# Patient Record
Sex: Female | Born: 1970
Health system: Southern US, Community
[De-identification: ages and names within clinical notes are randomized; demographics above are authoritative.]

## PROBLEM LIST (undated history)

## (undated) DIAGNOSIS — I10 Essential (primary) hypertension: Secondary | ICD-10-CM

## (undated) DIAGNOSIS — Z9889 Other specified postprocedural states: Secondary | ICD-10-CM

## (undated) DIAGNOSIS — R112 Nausea with vomiting, unspecified: Secondary | ICD-10-CM

## (undated) DIAGNOSIS — E039 Hypothyroidism, unspecified: Secondary | ICD-10-CM

## (undated) DIAGNOSIS — F329 Major depressive disorder, single episode, unspecified: Secondary | ICD-10-CM

## (undated) DIAGNOSIS — F32A Depression, unspecified: Secondary | ICD-10-CM

## (undated) DIAGNOSIS — J302 Other seasonal allergic rhinitis: Secondary | ICD-10-CM

## (undated) HISTORY — DX: Other seasonal allergic rhinitis: J30.2

## (undated) HISTORY — PX: ECTOPIC PREGNANCY SURGERY: SHX613

## (undated) HISTORY — DX: Hypothyroidism, unspecified: E03.9

## (undated) HISTORY — DX: Major depressive disorder, single episode, unspecified: F32.9

## (undated) HISTORY — DX: Depression, unspecified: F32.A

---

## 1990-05-10 HISTORY — PX: SHOULDER SURGERY: SHX246

## 1997-11-14 ENCOUNTER — Other Ambulatory Visit: Admission: RE | Admit: 1997-11-14 | Discharge: 1997-11-14 | Payer: Self-pay | Admitting: *Deleted

## 1997-11-18 ENCOUNTER — Observation Stay (HOSPITAL_COMMUNITY): Admission: AD | Admit: 1997-11-18 | Discharge: 1997-11-19 | Payer: Self-pay | Admitting: Gynecology

## 1998-05-08 ENCOUNTER — Encounter: Payer: Self-pay | Admitting: Obstetrics and Gynecology

## 1998-05-08 ENCOUNTER — Ambulatory Visit (HOSPITAL_COMMUNITY): Admission: RE | Admit: 1998-05-08 | Discharge: 1998-05-08 | Payer: Self-pay | Admitting: Obstetrics and Gynecology

## 1998-05-11 ENCOUNTER — Ambulatory Visit (HOSPITAL_COMMUNITY): Admission: RE | Admit: 1998-05-11 | Discharge: 1998-05-11 | Payer: Self-pay | Admitting: *Deleted

## 1998-05-12 ENCOUNTER — Ambulatory Visit (HOSPITAL_COMMUNITY): Admission: RE | Admit: 1998-05-12 | Discharge: 1998-05-12 | Payer: Self-pay | Admitting: Obstetrics and Gynecology

## 1998-05-13 ENCOUNTER — Ambulatory Visit (HOSPITAL_COMMUNITY): Admission: RE | Admit: 1998-05-13 | Discharge: 1998-05-13 | Payer: Self-pay | Admitting: Obstetrics and Gynecology

## 1999-01-27 ENCOUNTER — Inpatient Hospital Stay (HOSPITAL_COMMUNITY): Admission: AD | Admit: 1999-01-27 | Discharge: 1999-01-27 | Payer: Self-pay | Admitting: Obstetrics and Gynecology

## 1999-05-10 ENCOUNTER — Inpatient Hospital Stay (HOSPITAL_COMMUNITY): Admission: AD | Admit: 1999-05-10 | Discharge: 1999-05-13 | Payer: Self-pay | Admitting: Obstetrics and Gynecology

## 1999-06-04 ENCOUNTER — Encounter: Admission: RE | Admit: 1999-06-04 | Discharge: 1999-09-02 | Payer: Self-pay | Admitting: Obstetrics and Gynecology

## 1999-06-16 ENCOUNTER — Other Ambulatory Visit: Admission: RE | Admit: 1999-06-16 | Discharge: 1999-06-16 | Payer: Self-pay | Admitting: Obstetrics and Gynecology

## 2001-02-22 ENCOUNTER — Other Ambulatory Visit: Admission: RE | Admit: 2001-02-22 | Discharge: 2001-02-22 | Payer: Self-pay | Admitting: *Deleted

## 2001-02-22 ENCOUNTER — Encounter (INDEPENDENT_AMBULATORY_CARE_PROVIDER_SITE_OTHER): Payer: Self-pay | Admitting: Specialist

## 2001-04-10 ENCOUNTER — Other Ambulatory Visit: Admission: RE | Admit: 2001-04-10 | Discharge: 2001-04-10 | Payer: Self-pay | Admitting: Gynecology

## 2002-02-07 ENCOUNTER — Inpatient Hospital Stay (HOSPITAL_COMMUNITY): Admission: AD | Admit: 2002-02-07 | Discharge: 2002-02-09 | Payer: Self-pay | Admitting: Obstetrics and Gynecology

## 2002-03-14 ENCOUNTER — Other Ambulatory Visit: Admission: RE | Admit: 2002-03-14 | Discharge: 2002-03-14 | Payer: Self-pay | Admitting: Obstetrics and Gynecology

## 2003-08-12 ENCOUNTER — Other Ambulatory Visit: Admission: RE | Admit: 2003-08-12 | Discharge: 2003-08-12 | Payer: Self-pay | Admitting: Gynecology

## 2004-09-24 ENCOUNTER — Other Ambulatory Visit: Admission: RE | Admit: 2004-09-24 | Discharge: 2004-09-24 | Payer: Self-pay | Admitting: Gynecology

## 2005-10-14 ENCOUNTER — Other Ambulatory Visit: Admission: RE | Admit: 2005-10-14 | Discharge: 2005-10-14 | Payer: Self-pay | Admitting: Gynecology

## 2005-12-08 ENCOUNTER — Ambulatory Visit: Payer: Self-pay | Admitting: Gastroenterology

## 2005-12-30 ENCOUNTER — Encounter: Admission: RE | Admit: 2005-12-30 | Discharge: 2005-12-30 | Payer: Self-pay | Admitting: Gynecology

## 2006-11-29 ENCOUNTER — Other Ambulatory Visit: Admission: RE | Admit: 2006-11-29 | Discharge: 2006-11-29 | Payer: Self-pay | Admitting: Gynecology

## 2009-05-29 ENCOUNTER — Encounter: Admission: RE | Admit: 2009-05-29 | Discharge: 2009-05-29 | Payer: Self-pay | Admitting: Gynecology

## 2010-08-25 ENCOUNTER — Other Ambulatory Visit: Payer: Self-pay | Admitting: Gynecology

## 2010-08-25 DIAGNOSIS — Z1231 Encounter for screening mammogram for malignant neoplasm of breast: Secondary | ICD-10-CM

## 2010-09-09 ENCOUNTER — Ambulatory Visit
Admission: RE | Admit: 2010-09-09 | Discharge: 2010-09-09 | Disposition: A | Discharge status: Home / Self Care | Payer: BC Managed Care – PPO | Source: Ambulatory Visit | Attending: Gynecology | Admitting: Gynecology

## 2010-09-09 DIAGNOSIS — Z1231 Encounter for screening mammogram for malignant neoplasm of breast: Secondary | ICD-10-CM

## 2010-09-25 NOTE — Assessment & Plan Note (Signed)
[facility]                           GASTROENTEROLOGY OFFICE NOTE   NAME:Melissa House, Melissa House                       MRN:          [id number]  DATE:12/08/2005                            DOB:          02/24/1971    CHIEF COMPLAINT:  A 40 year old white female, self-referred for evaluation  of diarrhea and right-sided abdominal pain.   HISTORY OF PRESENT ILLNESS:  Ms. Gugel is followed by Dr. Chevis Pretty and Dr.  Corliss Blacker.  She relates problems with some intermittent right lower quadrant  pain that is related to ovulation.  This has been present for several years.  She has had progressively worsening problems with postprandial diarrhea.  It  has occurred for over 15 years but has generally been mild.  Over the past  several months her symptoms have increased and they now occur with almost  every meal she eats out and occasionally with meals at home.  She has some  mild lower abdominal cramping and substantial urgency.  The diarrhea was  associated with right lower quadrant pain on one occasion.  She notes no  melena, hematochezia, change in stool caliber, abdominal pain, rectal pain,  weight loss, nausea or vomiting, fevers or chills.   FAMILY HISTORY:  Negative for colon cancer, colon polyps or inflammatory  bowel disease.   She was seen by Dr. Chevis Pretty and apparently a pelvic ultrasound was  unremarkable, but the patient reports that Dr. Chevis Pretty thought he felt a  lesion between her right lower quadrant.  Unfortunately, I have no referral  records from Dr. Chevis Pretty at the time of this dictation.   PAST MEDICAL HISTORY:  1.  Hypothyroidism.  2.  Asthma.  3.  Allergies.  4.  Ectopic pregnancy in 1999.  5.  Status post left shoulder surgery in 1992.   CURRENT MEDICATIONS:  Listed on the chart, updated and reviewed.   MEDICATION ALLERGIES:  None known.   SOCIAL HISTORY:  Per handwritten form.   REVIEW OF SYSTEMS:  Several areas positive per handwritten  form.   PHYSICAL EXAMINATION:  GENERAL:  In no acute distress.  Height 5 feet 5  inches, weight 152.4 pounds, blood pressure is 100/56, pulse 68 and regular.  HEENT:  Anicteric sclerae.  Oropharynx clear.  CHEST:  Clear to auscultation bilaterally.  CARDIAC:  Regular rate and rhythm without murmurs appreciated.  ABDOMEN:  Soft, nontender, nondistended, normoactive bowel sounds.  No  palpable organomegaly, masses or hernias.  RECTAL:  Examination deferred to time of colonoscopy.  EXTREMITIES:  Without clubbing, cyanosis, or edema.  NEUROLOGIC:  Alert and oriented x3.  Grossly nonfocal.   ASSESSMENT AND PLAN:  Worsening postprandial diarrhea.  Presumed irritable  bowel syndrome.  Rule out inflammatory bowel disease, colorectal neoplasms,  celiac disease and other disorders.  Obtain stool Hemoccults, a CBC, CMET,  erythrocyte sedimentation rate and tissue transglutaminase.  She will begin  using Levsin 1-2 sublingually or orally before meals as needed.  Risks,  benefits, and alternatives to colonoscopy with possible biopsy and possible  polypectomy discussed with the patient and she consents to proceed.  This  will  be scheduled electively.  Consider further evaluation with a CT scan of  the abdomen and pelvis.                                   Venita Lick. Pleas Koch., MD, [facility]   MTS/MedQ  DD:  12/10/2005  DT:  12/10/2005  Job #:  [id number]

## 2011-05-27 ENCOUNTER — Encounter: Payer: Self-pay | Admitting: Gastroenterology

## 2011-06-09 ENCOUNTER — Encounter: Payer: Self-pay | Admitting: Gastroenterology

## 2011-06-09 ENCOUNTER — Ambulatory Visit (INDEPENDENT_AMBULATORY_CARE_PROVIDER_SITE_OTHER): Payer: BC Managed Care – PPO | Admitting: Gastroenterology

## 2011-06-09 VITALS — BP 120/60 | HR 75 | Ht 65.0 in | Wt 169.0 lb

## 2011-06-09 DIAGNOSIS — K219 Gastro-esophageal reflux disease without esophagitis: Secondary | ICD-10-CM | POA: Insufficient documentation

## 2011-06-09 MED ORDER — OMEPRAZOLE 40 MG PO CPDR: 40.0000 mg | DELAYED_RELEASE_CAPSULE | Freq: Every day | ORAL | Status: DC

## 2011-06-09 NOTE — Patient Instructions (Signed)
You have been scheduled for a Upper Endoscopy with propofol. See separate instructions. Patient advised to avoid spicy, acidic, citrus, chocolate, mints, fruit and fruit juices.  Limit the intake of caffeine, alcohol and Soda.  Don't exercise too soon after eating.  Don't lie down within 3-4 hours of eating.  Elevate the head of your bed. cc: Montey Hora, MD

## 2011-06-09 NOTE — Progress Notes (Signed)
History of Present Illness: This is a 41 year old female who relates a several month history of frequent belching, heartburn and globus sensation. She developed a pinched nerve in her back and treated with NSAIDs pain medications and steroid injections first several months beginning in October and her symptoms started around that time. She was placed on omeprazole 40 mg daily and her symptoms have generally been well controlled for the past month. Denies weight loss, abdominal pain, constipation, diarrhea, change in stool caliber, melena, hematochezia, nausea, vomiting, dysphagia.  Review of Systems: Pertinent positive and negative review of systems were noted in the above HPI section. All other review of systems were otherwise negative.  Current Medications, Allergies, Past Medical History, Past Surgical History, Family History and Social History were reviewed in [employer] record.  Physical Exam: General: Well developed , well nourished, no acute distress Head: Normocephalic and atraumatic Eyes:  sclerae anicteric, EOMI Ears: Normal auditory acuity Mouth: No deformity or lesions Neck: Supple, no masses or thyromegaly Lungs: Clear throughout to auscultation Heart: Regular rate and rhythm; no murmurs, rubs or bruits Abdomen: Soft, non tender and non distended. No masses, hepatosplenomegaly or hernias noted. Normal Bowel sounds Musculoskeletal: Symmetrical with no gross deformities  Skin: No lesions on visible extremities Pulses:  Normal pulses noted Extremities: No clubbing, cyanosis, edema or deformities noted Neurological: Alert oriented x 4, grossly nonfocal Cervical Nodes:  No significant cervical adenopathy Inguinal Nodes: No significant inguinal adenopathy Psychological:  Alert and cooperative. Normal mood and affect  Assessment and Recommendations:  1. Presumed GERD. Rule out esophagitis. Continue omeprazole 40 mg daily and standard antireflux measures. The  risks, benefits, and alternatives to endoscopy with possible biopsy and possible dilation were discussed with the patient and they consent to proceed.

## 2011-06-14 ENCOUNTER — Ambulatory Visit (AMBULATORY_SURGERY_CENTER): Payer: BC Managed Care – PPO | Admitting: Gastroenterology

## 2011-06-14 ENCOUNTER — Encounter: Payer: Self-pay | Admitting: Gastroenterology

## 2011-06-14 VITALS — BP 136/89 | HR 82 | Temp 98.4°F | Resp 18 | Ht 65.0 in | Wt 169.0 lb

## 2011-06-14 DIAGNOSIS — K219 Gastro-esophageal reflux disease without esophagitis: Secondary | ICD-10-CM

## 2011-06-14 HISTORY — PX: UPPER GASTROINTESTINAL ENDOSCOPY: SHX188

## 2011-06-14 MED ORDER — SODIUM CHLORIDE 0.9 % IV SOLN: 500.0000 mL | INTRAVENOUS | Status: DC

## 2011-06-14 NOTE — Op Note (Signed)
[facility] [address] [place], [place]  [address]  ENDOSCOPY PROCEDURE REPORT  PATIENT:  Tanika, Bracco  MR#:  [id number] BIRTHDATE:  1971/04/10, 41 yrs. old  GENDER:  female ENDOSCOPIST:  Judie Petit T. Russella Dar, MD, [facility]  PROCEDURE DATE:  06/14/2011 PROCEDURE:  EGD, diagnostic 43235 ASA CLASS:  Class II INDICATIONS:  GERD, globus MEDICATIONS:  MAC sedation, administered by CRNA, propofol (Diprivan) 150 mg IV TOPICAL ANESTHETIC:  none DESCRIPTION OF PROCEDURE:   After the risks benefits and alternatives of the procedure were thoroughly explained, informed consent was obtained.  The LB GIF-H180 [id number] endoscope was introduced through the mouth and advanced to the second portion of the duodenum, without limitations.  The instrument was slowly withdrawn as the mucosa was fully examined. <<PROCEDUREIMAGES>> The esophagus and gastroesophageal junction were completely normal in appearance.  The stomach was entered and closely examined. The pylorus, antrum, angularis, and lesser curvature were well visualized, including a retroflexed view of the cardia and fundus. The stomach wall was normally distensable. The scope passed easily through the pylorus into the duodenum.  The duodenal bulb was normal in appearance, as was the postbulbar duodenum.  Retroflexed views revealed no abnormalities.  The scope was then withdrawn from the patient and the procedure completed.  COMPLICATIONS:  None  ENDOSCOPIC IMPRESSION: 1) Normal EGD  RECOMMENDATIONS: 1) Anti-reflux regimen 2) PPI bid: omeprazole 40 mg po bid, #60, 11 refills  Rionna Feltes T. Russella Dar, MD, [facility]  n. eSIGNED:   Venita Lick. Truth Barot at 06/14/2011 09:01 AM  Lina Sayre, [id number]

## 2011-06-14 NOTE — Patient Instructions (Signed)
Please read the handouts given to you by your recovery room nurse.   You may resume your routine medications today.

## 2011-06-14 NOTE — Progress Notes (Signed)
Patient did not have preoperative order for IV antibiotic SSI prophylaxis. (G8918)  Patient did not experience any of the following events: a burn prior to discharge; a fall within the facility; wrong site/side/patient/procedure/implant event; or a hospital transfer or hospital admission upon discharge from the facility. (G8907)  

## 2011-06-15 ENCOUNTER — Telehealth: Payer: Self-pay | Admitting: *Deleted

## 2011-06-15 NOTE — Telephone Encounter (Signed)
  Follow up Call-  Call back number 06/14/2011  Post procedure Call Back phone  # [phone]  Permission to leave phone message Yes     Patient questions:  Do you have a fever, pain , or abdominal swelling? no Pain Score  0 *  Have you tolerated food without any problems? yes  Have you been able to return to your normal activities? yes  Do you have any questions about your discharge instructions: Diet   no Medications  no Follow up visit  no  Do you have questions or concerns about your Care? no  Actions: * If pain score is 4 or above: No action needed, pain <4.

## 2011-06-17 ENCOUNTER — Other Ambulatory Visit: Payer: Self-pay | Admitting: Gastroenterology

## 2011-06-17 MED ORDER — OMEPRAZOLE 40 MG PO CPDR: 40.0000 mg | DELAYED_RELEASE_CAPSULE | Freq: Two times a day (BID) | ORAL | Status: DC

## 2011-10-18 ENCOUNTER — Other Ambulatory Visit: Payer: Self-pay | Admitting: Gynecology

## 2011-10-18 DIAGNOSIS — Z1231 Encounter for screening mammogram for malignant neoplasm of breast: Secondary | ICD-10-CM

## 2011-10-26 ENCOUNTER — Ambulatory Visit
Admission: RE | Admit: 2011-10-26 | Discharge: 2011-10-26 | Disposition: A | Discharge status: Home / Self Care | Payer: BC Managed Care – PPO | Source: Ambulatory Visit | Attending: Gynecology | Admitting: Gynecology

## 2011-10-26 DIAGNOSIS — Z1231 Encounter for screening mammogram for malignant neoplasm of breast: Secondary | ICD-10-CM

## 2012-10-31 ENCOUNTER — Other Ambulatory Visit: Payer: Self-pay

## 2012-10-31 DIAGNOSIS — Z1231 Encounter for screening mammogram for malignant neoplasm of breast: Secondary | ICD-10-CM

## 2012-11-20 ENCOUNTER — Ambulatory Visit
Admission: RE | Admit: 2012-11-20 | Discharge: 2012-11-20 | Disposition: A | Discharge status: Home / Self Care | Payer: BC Managed Care – PPO | Source: Ambulatory Visit

## 2012-11-20 DIAGNOSIS — Z1231 Encounter for screening mammogram for malignant neoplasm of breast: Secondary | ICD-10-CM

## 2015-12-01 ENCOUNTER — Encounter: Payer: Self-pay | Admitting: Neurology

## 2015-12-01 ENCOUNTER — Ambulatory Visit (INDEPENDENT_AMBULATORY_CARE_PROVIDER_SITE_OTHER): Payer: Commercial Managed Care - PPO | Admitting: Neurology

## 2015-12-01 VITALS — BP 119/76 | HR 72 | Ht 65.0 in | Wt 186.5 lb

## 2015-12-01 DIAGNOSIS — G4730 Sleep apnea, unspecified: Secondary | ICD-10-CM

## 2015-12-01 DIAGNOSIS — G4710 Hypersomnia, unspecified: Secondary | ICD-10-CM

## 2015-12-01 DIAGNOSIS — E669 Obesity, unspecified: Secondary | ICD-10-CM

## 2015-12-01 DIAGNOSIS — J302 Other seasonal allergic rhinitis: Secondary | ICD-10-CM

## 2015-12-01 NOTE — Progress Notes (Signed)
SLEEP MEDICINE CLINIC   Provider:  Larey House, M D  Referring Provider: Garlan Fillers, MD Primary Care Physician:  Melissa Pillar, MD  Chief Complaint  Patient presents with  . Other    Rm 11,  new pt, "Excessive daytime sleepiness for years since children were born; sleep study done ~ 4-5 years ago"    HPI:  Melissa House is a 45 y.o. female , seen here as a referral  from [place] for a sleep consultation,   Chief complaint according to patient :" Fragmented sleep since my children were born"  Melissa House reports that she has had sleep issues for well over 15 years, and she had always wondered if her fragmented sleep was also related to her underlying thyroid condition. She reports that only with Ambien which he get 4 or 5 hours of consecutive sleep. Her primary care physician became Melissa House at the time and she did not want to continue Ambien indefinitely and therefore initiated a workup including a sleep study 5 years ago. This was performed at [facility]  and showed only that the patient did not have apnea or periodic limb movements but fragmented sleep. She remained on Ambien, by now for about 12 years.  She is also followed by Dr. Tamala Julian, an endocrinologist at [facility], and has felt that her thyroid condition is better controlled than ever. She feels overall very well -she's not depressed, she does not lack energy in daytime but she wonders why her sleep has not improved.  She reports being drowsy when driving - independent of how long the road trip is. She reports it his worse in the afternoon.   Sleep habits are as follows: She usually goes to the bedroom between 9:30 and 10 and reads for about half an hour before she drifts to sleep. She does not struggle to go to sleep as long as she takes Ambien, she will sleep for about 4 hours but then her sleep will become fragmented and she wakes up in shorter and shorter intervals. Her bedroom is described as  cool, quiet and dark and she shares the bed with her husband. Her husband has not noted any abnormal breathing, dream enactment or kicking. She does not report vivid or bizarre dreaming. She sleeps mostly on her side but often wakes up on her back. She sleeps on one pillow only. She is not woken by the urge to urinate, but when she wakes up she was sometimes will have a bathroom break. She does not wake up with headaches nor from headaches. She remains unrefreshed and unrestored with the feeling of not getting enough sleep. She usually rises at 6 AM, she relies on an alarm but she is usually awake before. She feels that she gets enough hours of sleep but that the quality of sleep is lacking. Only when she has nasal congestion or a cold which she breathes through her mouth and snores. In her review of systems she endorsed sleepiness, memory loss, the feeling of not enough sleep.   Sleep medical history and family sleep history:  No apnea history, no personal history of sleep walking or night terrors. No parental history of apnea.  Social history: Married with children, working as a Pharmacist, hospital. He avoids caffeine, because of headaches. She does not drink alcohol, she does not use tobacco products. Patient reports being very sensitive to caffeine products as well as chocolate.  Review of Systems: Out of a complete 14 system review, the  patient complains of only the following symptoms, and all other reviewed systems are negative.  Epworth score 16 , Fatigue severity score 42   , depression score n/a    Social History   Social History  . Marital status: Married    Spouse name: N/A  . Number of children: 2  . Years of education: 16   Occupational History  . [employer]   Social History Main Topics  . Smoking status: Never Smoker  . Smokeless tobacco: Never Used  . Alcohol use Yes     Comment: occasional  . Drug use: No  . Sexual activity: Not on file   Other  Topics Concern  . Not on file   Social History Narrative   Lives at home with husband, children    Caffeine use- 1-2 a week    Family History  Problem Relation Age of Onset  . Hypertension Mother   . Hypercholesterolemia Mother   . Hypertension Father   . Lymphoma Father     NHL  . Colon cancer Neg Hx     Past Medical History:  Diagnosis Date  . Asthma   . Depression   . Ectopic pregnancy 1999  . Hypothyroidism   . Seasonal allergies     Past Surgical History:  Procedure Laterality Date  . SHOULDER SURGERY  1992   Left    Current Outpatient Prescriptions  Medication Sig Dispense Refill  . citalopram (CELEXA) 10 MG tablet Take 10 mg by mouth daily.    . norethindrone-ethinyl estradiol-iron (MICROGESTIN FE,GILDESS FE,LOESTRIN FE) 1.5-30 MG-MCG tablet Take 1 tablet by mouth daily.    Marland Kitchen thyroid (ARMOUR THYROID) 120 MG tablet Take 120 mg by mouth daily.    Marland Kitchen zolpidem (AMBIEN) 10 MG tablet Take 5 mg by mouth daily. At bedtime     No current facility-administered medications for this visit.     Allergies as of 12/01/2015  . (No Known Allergies)    Vitals: BP 119/76 (BP Location: Right Arm, Patient Position: Sitting, Cuff Size: Normal)   Pulse 72   Ht '5\' 5"'  (1.651 m)   Wt 186 lb 8 oz (84.6 kg)   BMI 31.04 kg/m  Last Weight:  Wt Readings from Last 1 Encounters:  12/01/15 186 lb 8 oz (84.6 kg)   GBT:DVVO mass index is 31.04 kg/m.     Last Height:   Ht Readings from Last 1 Encounters:  12/01/15 '5\' 5"'  (1.651 m)    Physical exam:  General: The patient is awake, alert and appears not in acute distress. The patient is well groomed. Head: Normocephalic, atraumatic. Neck is supple. Mallampati 3,  neck circumference: 14.5 . Nasal airflow patent , TMJ is evident . Retrognathia is not seen.  Cardiovascular:  Regular rate and rhythm , without  murmurs or carotid bruit, and without distended neck veins. Respiratory: Lungs are clear to auscultation. Skin:  Without  evidence of edema, or rash Trunk: BMI is elevated . The patient's posture is erect   Neurologic exam : The patient is awake and alert, oriented to place and time.   Memory subjective described as intact.  Attention span & concentration ability appears normal.  Speech is fluent,  without dysarthria, dysphonia or aphasia.  Mood and affect are appropriate.  Cranial nerves:  no changes in smell or taste . Pupils are equal and briskly reactive to light.  Extraocular movements  in vertical and horizontal planes intact and without nystagmus. Visual fields by finger perimetry  are intact. Hearing to finger rub intact. Facial sensation intact to fine touch.Facial motor strength is symmetric and tongue and uvula move midline. Shoulder shrug was symmetrical.   Motor exam:  Normal tone, muscle bulk and symmetric strength in all extremities.  Sensory:  Fine touch, pinprick and vibration were tested in all extremities.  Proprioception tested in the upper extremities was normal.  Coordination:  without evidence of ataxia, dysmetria or tremor.  Gait and station: Patient walks without assistive device and is able unassisted to climb up to the exam table. Strength within normal limits.  Stance is stable and normal.    Deep tendon reflexes: in the  upper and lower extremities are symmetric and intact. Babinski maneuver response is downgoing.  The patient was advised of the nature of the diagnosed sleep disorder , the treatment options and risks for general a health and wellness arising from not treating the condition.  I spent more than 35 minutes of face to face time with the patient. Greater than 50% of time was spent in counseling and coordination of care. We have discussed the diagnosis and differential and I answered the patient's questions.     Assessment:  After physical and neurologic examination, review of laboratory studies,  Personal review of imaging studies, reports of other /same  Imaging  studies ,  Results of polysomnography/ neurophysiology testing and pre-existing records as far as provided in visit., my assessment is   1) Mrs. Brands reports excessive daytime sleepiness and endorses the Epworth Sleepiness Scale at 16 points. She has been evaluated for sleep disorder about 5 years ago and her sleep was just described as fragmented but no organic sleep disorder was identified. She has been chronically using Ambien. Her husband has not reported that she snores, is restless, or has acted out dreams.  What I understand the concern about a chronic use of Ambien, especially since it can affect memory function, I also want to make sure that there is no organic sleep disorder underlying her insomnia now. It will be very difficult to wean off Ambien after over a decade of use. I usually try to encourage patients to take Ambien every other day or just 3 days a week alternating with another sleep aid that does not work on the GABA receptor, since her problem seems to arise after 3 or 4 AM when her sleep becomes more fragmented it may be worthwhile to use a short acting sleep aid only after she wakes up for the first time. Usually Sonata can help to add 2 or 3 hours of consecutive sleep after an interruption. She has not had any recent experience of at night with out Ambien and I'm not sure that she can initiate sleep without it.  My goal is to evaluate her and attended sleep study I would like for her to take 5 mg of Ambien on the to see if it's enough to initiate sleep and not cause rebounding insomnia, and my goal may be to replace it with Sonata. She definitely reports excessive daytime sleepiness and hypoxemia is to be evaluated.   2) replace with Trazodone or Elavil.    Plan:  Treatment plan and additional workup : attended slep study, since she gained weight since her last study. Oxygen measures. No evidence of vivid dreams, but she has sleep attacks- a curtain lowering over her,  sleepiness is severe. Driving is not safe.  HLA test.    Melissa Partridge Keyani Rigdon MD  12/01/2015   CC: Wannetta Sender  [address], [address], [place] [address]

## 2015-12-01 NOTE — Patient Instructions (Signed)
Hypersomnia Hypersomnia is when you feel extremely tired during the day even though you're getting plenty of sleep at night. You may need to take naps during the day, and you may also be extremely difficult to wake up when you are sleeping.  CAUSES  The cause of your hypersomnia may not be known. Hypersomnia may be caused by:   Medicines.  Sleep disorders, such as narcolepsy.  Trauma or injury to your head or nervous system.  Using drugs or alcohol.  Tumors.  Medical conditions, such as depression or hypothyroidism.  Genetics. SIGNS AND SYMPTOMS  The main symptoms of hypersomnia include:   Feeling extremely tired throughout the day.  Being very difficult to wake up.  Sleeping for longer and longer periods.  Taking naps throughout the day. Other symptoms may include:   Feeling:  Restless.  Annoyed.  Anxious.  Low energy.  Having difficulty:  Remembering.  Speaking.  Thinking.  Losing your appetite.  Experiencing hallucinations. DIAGNOSIS  Hypersomnia may be diagnosed by:  Medical history and physical exam. This will include a sleep history.  Completing sleep logs.  Tests may also be done, such as:  Polysomnography.  Multiple sleep latency test (MSLT). TREATMENT  There is no cure for hypersomnia, but treatment can be very effective in helping manage the condition. Treatment may include:  Lifestyle and sleeping strategies to help cope with the condition.  Stimulant medicines.  Treating any underlying causes of hypersomnia. HOME CARE INSTRUCTIONS  Take medicines only as directed by your health care provider.  Schedule short naps for when you feel sleepiest during the day. Tell your employer or teachers that you have hypersomnia. You may be able to adjust your schedule to include time for naps.  Avoid drinking alcohol or caffeinated beverages.  Do not eat a heavy meal before bedtime. Eat at about the same times every day.  Do not drive or  operate heavy machinery if you are sleepy.  Do not swim or go out on the water without a life jacket.  If possible, adjust your schedule so that you do not have to work or be active at night.  Keep all follow-up visits as directed by your health care provider. This is important. SEEK MEDICAL CARE IF:   You have new symptoms.  Your symptoms get worse. SEEK IMMEDIATE MEDICAL CARE IF:  You have serious thoughts of hurting yourself or someone else.   This information is not intended to replace advice given to you by your health care provider. Make sure you discuss any questions you have with your health care provider.   Document Released: 04/16/2002 Document Revised: 05/17/2014 Document Reviewed: 11/29/2013 Elsevier Interactive Patient Education 2016 [employer] Inc[employer]  

## 2015-12-26 ENCOUNTER — Ambulatory Visit (INDEPENDENT_AMBULATORY_CARE_PROVIDER_SITE_OTHER): Payer: Commercial Managed Care - PPO | Admitting: Neurology

## 2015-12-26 DIAGNOSIS — G4710 Hypersomnia, unspecified: Secondary | ICD-10-CM

## 2015-12-26 DIAGNOSIS — J302 Other seasonal allergic rhinitis: Secondary | ICD-10-CM

## 2015-12-26 DIAGNOSIS — E669 Obesity, unspecified: Secondary | ICD-10-CM

## 2015-12-31 ENCOUNTER — Telehealth: Payer: Self-pay | Admitting: Neurology

## 2015-12-31 NOTE — Telephone Encounter (Signed)
I called pt to discuss. No answer, left a detailed message on home phone per DPR.  I advised her that when I have her sleep study results, I will call her and schedule a follow up. It takes 10-14 days to get sleep study results back. I asked pt to call me back with further questions.

## 2015-12-31 NOTE — Telephone Encounter (Signed)
Patient called to schedule follow up from Friday August 18th NPSG to go over results. Kristen please advise if patient does need follow up or patient will be advised as to when to return for follow up when you call back with results. If okay to schedule follow up, how soon to come back? Please advise.

## 2016-01-06 ENCOUNTER — Telehealth: Payer: Self-pay

## 2016-01-06 NOTE — Telephone Encounter (Signed)
I called pt to discuss sleep study results. No answer, left a message on pt's home phone asking her to call me back.

## 2016-01-07 ENCOUNTER — Ambulatory Visit (INDEPENDENT_AMBULATORY_CARE_PROVIDER_SITE_OTHER): Payer: Commercial Managed Care - PPO | Admitting: Neurology

## 2016-01-07 ENCOUNTER — Encounter: Payer: Self-pay | Admitting: Neurology

## 2016-01-07 VITALS — BP 120/78 | HR 68 | Resp 20 | Ht 65.0 in | Wt 183.0 lb

## 2016-01-07 DIAGNOSIS — G4719 Other hypersomnia: Secondary | ICD-10-CM

## 2016-01-07 DIAGNOSIS — F5101 Primary insomnia: Secondary | ICD-10-CM | POA: Diagnosis not present

## 2016-01-07 MED ORDER — MODAFINIL 200 MG PO TABS: 200.0000 mg | ORAL_TABLET | Freq: Every day | ORAL | 5 refills | Status: DC

## 2016-01-07 MED ORDER — ZALEPLON 10 MG PO CAPS: 10.0000 mg | ORAL_CAPSULE | Freq: Every evening | ORAL | 1 refills | Status: DC | PRN

## 2016-01-07 NOTE — Progress Notes (Signed)
SLEEP MEDICINE CLINIC   Provider:  Melvyn Novas, M D  Referring Provider: Kendrick Ranch, * Primary Care Physician:  [name], MD  Chief Complaint  Patient presents with  . Follow-up    discuss sleep study results    HPI:  Melissa House is a 45 y.o. female , seen here as a referral  from Dr.Schoenhoff for a sleep consultation,   Chief complaint according to patient :" Fragmented sleep since my children were born"  Melissa House reports that she has had sleep issues for well over 15 years, and she had always wondered if her fragmented sleep was also related to her underlying thyroid condition. She reports that only with Ambien which he get 4 or 5 hours of consecutive sleep. Her primary care physician became Dr. Dimple Casey at the time and she did not want to continue Ambien indefinitely and therefore initiated a workup including a sleep study 5 years ago. This was performed at [facility]  and showed only that the patient did not have apnea or periodic limb movements but fragmented sleep. She remained on Ambien, by now for about 12 years.  She is also followed by Dr. Katrinka Blazing, an endocrinologist at [facility], and has felt that her thyroid condition is better controlled than ever. She feels overall very well -she's not depressed, she does not lack energy in daytime but she wonders why her sleep has not improved.  She reports being drowsy when driving - independent of how long the road trip is. She reports it his worse in the afternoon.   Sleep habits are as follows: She usually goes to the bedroom between 9:30 and 10 and reads for about half an hour before she drifts to sleep. She does not struggle to go to sleep as long as she takes Ambien, she will sleep for about 4 hours but then her sleep will become fragmented and she wakes up in shorter and shorter intervals. Her bedroom is described as cool, quiet and dark and she shares the bed with her husband. Her husband has  not noted any abnormal breathing, dream enactment or kicking. She does not report vivid or bizarre dreaming. She sleeps mostly on her side but often wakes up on her back. She sleeps on one pillow only. She is not woken by the urge to urinate, but when she wakes up she was sometimes will have a bathroom break. She does not wake up with headaches nor from headaches. She remains unrefreshed and unrestored with the feeling of not getting enough sleep. She usually rises at 6 AM, she relies on an alarm but she is usually awake before. She feels that she gets enough hours of sleep but that the quality of sleep is lacking. Only when she has nasal congestion or a cold which she breathes through her mouth and snores. In her review of systems she endorsed sleepiness, memory loss, the feeling of not enough sleep.   Sleep medical history and family sleep history:  No apnea history, no personal history of sleep walking or night terrors. No parental history of apnea.  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 She does not drink alcohol, she does not use tobacco products. Patient reports being very sensitive to caffeine products as well as chocolate.   01-07-2016, I am meeting today with Melissa House after her recent polysomnography from 08/26/2015. I would like to start with a quotation from her Epworth score which  she endorsed at 19 points and last visit at 56. Her sleep study revealed an AHI of 3.3 and only during REM sleep was clinically significant AHI of 16.2 in supine sleep 11.6. My recommendations were to avoid supine sleep and also to treat snoring. She may benefit from a dental device weight loss or a possible ENT surgery. However this very mild sleep apnea does not at all explained her excessive daytime sleepiness. She reports no vivid dreaming, no sleep paralysis. He may sometimes wake up sore but she does not feel that her sleep is fragmented by  discomfort or pain. Her sleep was fragmented in spite of taking Ambien but I could not find a physiological reason for it. He does sometimes naps but does not dream but nothing to her recollection. I suspect a level of anxiety- and still recommend avoiding supine sleep. Tennisball methode. She has tried melatonin without success.   Review of Systems: Out of a complete 14 system review, the patient complains of only the following symptoms, and all other reviewed systems are negative.  Epworth score 19, Fatigue severity score 42   , depression score n/a    Social History   Social History  . Marital status: Married    Spouse name: N/A  . Number of children: 2  . Years of education: 16   Occupational History  . [employer]   Social History Main Topics  . Smoking status: Never Smoker  . Smokeless tobacco: Never Used  . Alcohol use Yes     Comment: occasional  . Drug use: No  . Sexual activity: Not on file   Other Topics Concern  . Not on file   Social History Narrative   Lives at home with husband, children    Caffeine use- 1-2 a week    Family History  Problem Relation Age of Onset  . Hypertension Mother   . Hypercholesterolemia Mother   . Hypertension Father   . Lymphoma Father     NHL  . Colon cancer Neg Hx     Past Medical History:  Diagnosis Date  . Asthma   . Depression   . Ectopic pregnancy 1999  . Hypothyroidism   . Seasonal allergies     Past Surgical History:  Procedure Laterality Date  . SHOULDER SURGERY  1992   Left    Current Outpatient Prescriptions  Medication Sig Dispense Refill  . citalopram (CELEXA) 10 MG tablet Take 10 mg by mouth daily.    . norethindrone-ethinyl estradiol-iron (MICROGESTIN FE,GILDESS FE,LOESTRIN FE) 1.5-30 MG-MCG tablet Take 1 tablet by mouth daily.    Marland Kitchen thyroid (ARMOUR THYROID) 120 MG tablet Take 120 mg by mouth daily.    Marland Kitchen zolpidem (AMBIEN) 10 MG tablet Take 5 mg by mouth daily. At  bedtime     No current facility-administered medications for this visit.     Allergies as of 01/07/2016  . (No Known Allergies)    Vitals: BP 120/78   Pulse 68   Resp 20   Ht 5\' 5"  (1.651 m)   Wt 183 lb (83 kg)   BMI 30.45 kg/m  Last Weight:  Wt Readings from Last 1 Encounters:  01/07/16 183 lb (83 kg)   JXB:JYNW mass index is 30.45 kg/m.     Last Height:   Ht Readings from Last 1 Encounters:  01/07/16 5\' 5"  (1.651 m)    Physical exam:  General: The patient is awake, alert and appears not in acute distress. The  patient is well groomed. Head: Normocephalic, atraumatic. Neck is supple. Mallampati 3,  neck circumference: 14.5 . Nasal airflow patent , TMJ is evident . Retrognathia is not seen.  Cardiovascular:  Regular rate and rhythm , without  murmurs or carotid bruit, and without distended neck veins. Respiratory: Lungs are clear to auscultation. Skin:  Without evidence of edema, or rash Trunk: BMI is elevated . The patient's posture is erect   Neurologic exam : The patient is awake and alert, oriented to place and time.   Memory subjective described as intact.  Attention span & concentration ability appears normal.  Speech is fluent,  without dysarthria, dysphonia or aphasia.  Mood and affect are appropriate.  Cranial nerves:  no changes in smell or taste . Pupils are equal and briskly reactive to light.  Extraocular movements  in vertical and horizontal planes intact and without nystagmus. Visual fields by finger perimetry are intact. Hearing to finger rub intact. Facial sensation intact to fine touch.Facial motor strength is symmetric and tongue and uvula move midline. Shoulder shrug was symmetrical.   Motor exam:  Normal tone, muscle bulk and symmetric strength in all extremities.  Sensory:  Fine touch, pinprick and vibration were tested in all extremities.  Proprioception tested in the upper extremities was normal.  Coordination:  without evidence of ataxia,  dysmetria or tremor.  Gait and station: Patient walks without assistive device and is able unassisted to climb up to the exam table. Strength within normal limits.  Stance is stable and normal.    Deep tendon reflexes: in the  upper and lower extremities are symmetric and intact. Babinski maneuver response is downgoing.  The patient was advised of the nature of the diagnosed sleep disorder , the treatment options and risks for general a health and wellness arising from not treating the condition.  I spent more than 35 minutes of face to face time with the patient. Greater than 50% of time was spent in counseling and coordination of care. We have discussed the diagnosis and differential and I answered the patient's questions.     Assessment:  After physical and neurologic examination, review of laboratory studies,  Personal review of imaging studies, reports of other /same  Imaging studies ,  Results of polysomnography/ neurophysiology testing and pre-existing records as far as provided in visit., my assessment is   1) Melissa House reports excessive daytime sleepiness and endorses the Epworth Sleepiness Scale at 19points. She has been evaluated for sleep disorder about 5 years ago and her sleep was just described as fragmented but no organic sleep disorder was identified. Her husband has not reported that she snores, is restless, or has acted out dreams. Her most recent sleep study again did not show any reason or physiological cause for her fragmented sleep but his sleep is fragmented until about 1 AM.   Instead of continuing to use Ambien I would like to try Sonata a shorter acting sleep aid that works usually for about 3 hours it may be enough for her to him to sleep and consolidate her sleep for the first half of the night without waking groggy. I'm especially concerned about her reports of falling asleep or nearly falling asleep while driving. Plan 2) replace with Trazodone or Elavil.       Plan:  Treatment plan and additional workup : attended slep study, since she gained weight since her last study. Oxygen measures. No evidence of vivid dreams, but she has sleep attacks- a curtain lowering over  her, sleepiness is severe. Driving is not safe.     Melissa Mylararmen Deaglan Lile MD  01/07/2016   CC: Kendrick Rancheborah D Schoenhoff, Md [address] [place], [place] [id number]

## 2016-01-07 NOTE — Telephone Encounter (Signed)
I spoke to pt and advised her that her sleep study did not reveal significant sleep apnea or significant periodic limb movements of sleep resulting in significant sleep disruption. Snoring was noted. I advised her that a dental device, weight loss, and possible ENT procedures need to be discussed. CPAP is not indicated. Positional therapy is advised. I advised pt to avoid sedative-hypnotics which may worsen sleep apnea, alcohol and tobacco. Pt is requesting a follow up appt with Dr. Vickey Hugerohmeier to discuss. I offered her an appt today at 2:30pm which pt accepted. Pt verbalized understanding of results. Pt had no questions at this time but was encouraged to call back if questions arise.

## 2016-01-07 NOTE — Patient Instructions (Signed)
Hypersomnia Hypersomnia is when you feel extremely tired during the day even though you're getting plenty of sleep at night. You may need to take naps during the day, and you may also be extremely difficult to wake up when you are sleeping.  CAUSES  The cause of your hypersomnia may not be known. Hypersomnia may be caused by:   Medicines.  Sleep disorders, such as narcolepsy.  Trauma or injury to your head or nervous system.  Using drugs or alcohol.  Tumors.  Medical conditions, such as depression or hypothyroidism.  Genetics. SIGNS AND SYMPTOMS  The main symptoms of hypersomnia include:   Feeling extremely tired throughout the day.  Being very difficult to wake up.  Sleeping for longer and longer periods.  Taking naps throughout the day. Other symptoms may include:   Feeling:  Restless.  Annoyed.  Anxious.  Low energy.  Having difficulty:  Remembering.  Speaking.  Thinking.  Losing your appetite.  Experiencing hallucinations. DIAGNOSIS  Hypersomnia may be diagnosed by:  Medical history and physical exam. This will include a sleep history.  Completing sleep logs.  Tests may also be done, such as:  Polysomnography.  Multiple sleep latency test (MSLT). TREATMENT  There is no cure for hypersomnia, but treatment can be very effective in helping manage the condition. Treatment may include:  Lifestyle and sleeping strategies to help cope with the condition.  Stimulant medicines.  Treating any underlying causes of hypersomnia. HOME CARE INSTRUCTIONS  Take medicines only as directed by your health care provider.  Schedule short naps for when you feel sleepiest during the day. Tell your employer or teachers that you have hypersomnia. You may be able to adjust your schedule to include time for naps.  Avoid drinking alcohol or caffeinated beverages.  Do not eat a heavy meal before bedtime. Eat at about the same times every day.  Do not drive or  operate heavy machinery if you are sleepy.  Do not swim or go out on the water without a life jacket.  If possible, adjust your schedule so that you do not have to work or be active at night.  Keep all follow-up visits as directed by your health care provider. This is important. SEEK MEDICAL CARE IF:   You have new symptoms.  Your symptoms get worse. SEEK IMMEDIATE MEDICAL CARE IF:  You have serious thoughts of hurting yourself or someone else.   This information is not intended to replace advice given to you by your health care provider. Make sure you discuss any questions you have with your health care provider.   Document Released: 04/16/2002 Document Revised: 05/17/2014 Document Reviewed: 11/29/2013 Elsevier Interactive Patient Education 2016 [employer] Inc[employer]  

## 2016-01-07 NOTE — Telephone Encounter (Signed)
Patient is returning your call. She can be reached at [phone].

## 2016-01-08 ENCOUNTER — Telehealth: Payer: Self-pay

## 2016-01-08 DIAGNOSIS — G4719 Other hypersomnia: Secondary | ICD-10-CM

## 2016-01-08 DIAGNOSIS — G4711 Idiopathic hypersomnia with long sleep time: Secondary | ICD-10-CM

## 2016-01-08 DIAGNOSIS — F5101 Primary insomnia: Secondary | ICD-10-CM

## 2016-01-08 NOTE — Telephone Encounter (Signed)
Modafinil was denied by [facility] because:  1. Pt does not have a diagnosis of osa of mor that 5 obstructive respiratory events per hour confirmed by sleep study.  2. Treatment of sleep apnea by cpap for bipap for over 3 months and that she is fully compliant.   How would you like to proceed?

## 2016-01-08 NOTE — Telephone Encounter (Signed)
PA for modafinil completed and sent to [facility]. Should have a determination in 1-3 business days.

## 2016-01-09 NOTE — Telephone Encounter (Signed)
What about pathological hypersomnia?

## 2016-01-13 ENCOUNTER — Telehealth: Payer: Self-pay | Admitting: Neurology

## 2016-01-13 NOTE — Telephone Encounter (Signed)
Patient called to advise has been "taking Sonata for 6 days, 1st day slept 3 hrs, 2nd day 2 hrs, 3rd day 1 hr, not staying asleep at all at the beginning of the night, Provigil took 2 days, couldn't fall asleep", patient also reports headache for 6 days, "can get rid of it but keeps coming back", please call [phone]..Marland Kitchen

## 2016-01-13 NOTE — Telephone Encounter (Signed)
Give med more time to work and then discuss with dr. Vickey Hugerdohmeier on Wednesday. -VRP

## 2016-01-13 NOTE — Telephone Encounter (Signed)
Spoke with patient who stated the Sonata doesn't seem to be working. She is unsure if the dose can be increased or if she needs to be on Sonata longer to give it time to work. She doesn't fall asleep right away, has fragmented sleep during the night. She stated she took Provigil at 9 am two mornings in a row, was unsure if that was causing her sleep issues. She took it earlier today.  She understands Dr Vickey Hugerohmeier is out of the office until next Wed, and her RN is out of the office as well. Per Dr Oliva Bustardohmeier's note, as alternate plan, she may try two other medications. Patient stated she was unaware of that, but she doesn't know what to do to get more sleep. She then stated she Is having headaches, unsure if this is side effect of going off Ambien or side effect of Sonata. She can get rid of headaches, "but they come right back". Advised her this RN will route to work in dr and call her back by end of day today. She verbalized understanding, appreciations.

## 2016-01-13 NOTE — Telephone Encounter (Signed)
Per Dr Marjory LiesPenumalli, spoke with patient and advised she give medication more time to work effectively. Advised she call next Wed to speak with Baxter HireKristen and Dr Vickey Hugerohmeier. She verbalized understanding, appreciation for call back.

## 2016-01-19 NOTE — Telephone Encounter (Signed)
[facility] will not cover modafinil for hypersomnia.  Do you wish to change medications?

## 2016-01-21 NOTE — Telephone Encounter (Signed)
Pt called said she is getting about 1 continuous sleep and then waking and falling back to sleep the rest of the night. She said the other issues have resolved. Please call

## 2016-01-22 MED ORDER — ESZOPICLONE 1 MG PO TABS: 1.0000 mg | ORAL_TABLET | Freq: Every evening | ORAL | 0 refills | Status: DC | PRN

## 2016-01-22 NOTE — Telephone Encounter (Signed)
[facility] Received a receipt of confirmation. I spoke to pt and advised her of this. Pt verbalized understanding.

## 2016-01-22 NOTE — Telephone Encounter (Signed)
Patient called to advise [facility] [place] [place] has not received Rx for eszopiclone (LUNESTA) 1 MG TABS tablet.

## 2016-01-22 NOTE — Addendum Note (Signed)
Addended by: Geronimo RunningINKINS, Shaunae Sieloff A on: 01/22/2016 10:48 AM   Modules accepted: Orders

## 2016-01-22 NOTE — Telephone Encounter (Signed)
I spoke to Dr. Vickey Hugerohmeier. Received a verbal order from Dr. Vickey Hugerohmeier to start the pt on lunesta 1mg  qhs, disp 30 tablets 0 refills.   I called pt. I advised her that Dr. Vickey Hugerohmeier recommends trying lunesta to see if that helps her stay asleep longer. I reviewed side effects of lunesta with the pt but encouraged her to also discuss with her pharmacist. Pt asked if it were ok to stop sonata and then take lunesta the following night and if lunesta is as addictive as ambien?  I again spoke to Dr. Vickey Hugerohmeier. Dr. Vickey Hugerohmeier advised me that sonata only lasts 2 hours and it will be ok for the pt to take sonata one night and then start lunesta the following night. She also advised me that Zambialunesta is less addictive than ambien.  I called pt back and explained this all to her. She is agreeable to starting lunesta and asked that I [facility] the [facility]. Pt verbalized understanding.

## 2016-01-23 MED ORDER — MODAFINIL 200 MG PO TABS: 200.0000 mg | ORAL_TABLET | Freq: Every day | ORAL | 5 refills | Status: DC

## 2016-01-23 NOTE — Addendum Note (Signed)
Addended by: Melvyn NovasHMEIER, Assad Harbeson on: 01/23/2016 09:40 AM   Modules accepted: Orders

## 2016-01-27 MED ORDER — METHYLPHENIDATE HCL 10 MG PO TABS: 10.0000 mg | ORAL_TABLET | Freq: Two times a day (BID) | ORAL | 0 refills | Status: DC

## 2016-01-27 NOTE — Telephone Encounter (Signed)
Patient is returning your call.  

## 2016-01-27 NOTE — Telephone Encounter (Addendum)
I spoke to pt. I advised her that her HLA test for narcolepsy was negative, which is normal. Pt verbalized understanding of results.  Pt says that she knows the modafinil was denied by her insurance but she was able to get it from [facility] for only $35. She does not want the ritalin RX but wants to continue the modafinil. I have shredded the ritalin RX.

## 2016-01-27 NOTE — Addendum Note (Signed)
Addended by: Geronimo RunningINKINS, Kayd Launer A on: 01/27/2016 03:52 PM   Modules accepted: Orders

## 2016-01-27 NOTE — Telephone Encounter (Signed)
[facility] will not cover modafinil for hypersomnia.  Pt must have a diagnosis of osa or more than 5 obstructive respiratory events per hour confirmed by a sleep study. Pt also must have treatment of sleep apnea by cpap or bipap for over 3 months and that she is fully compliant.  Do you wish to change medications?

## 2016-01-27 NOTE — Telephone Encounter (Signed)
I called pt to discuss ritalin and HLA results. No answer, left a message asking her to call me back.

## 2016-01-27 NOTE — Telephone Encounter (Signed)
I will share this note with Dr. Constance GoltzSchoenhoff, the patient's primary care physician. Unfortunately modafinil was not covered by the patient's insurance and for this reason I will try Ritalin for excessive daytime sleepiness and fatigue.Tamarick Kovalcik, MD

## 2016-01-27 NOTE — Addendum Note (Signed)
Addended by: Melvyn NovasHMEIER, Keanu Lesniak on: 01/27/2016 12:32 PM   Modules accepted: Orders

## 2016-02-03 ENCOUNTER — Telehealth: Payer: Self-pay | Admitting: *Deleted

## 2016-02-03 ENCOUNTER — Telehealth: Payer: Self-pay | Admitting: Neurology

## 2016-02-03 MED ORDER — ESZOPICLONE 1 MG PO TABS: 2.0000 mg | ORAL_TABLET | Freq: Every evening | ORAL | 0 refills | Status: DC | PRN

## 2016-02-03 MED ORDER — ESZOPICLONE 1 MG PO TABS: 1.0000 mg | ORAL_TABLET | Freq: Every evening | ORAL | 0 refills | Status: DC | PRN

## 2016-02-03 NOTE — Telephone Encounter (Signed)
I faxed pt, note from 01/07/16 to [facility] on 02/03/16

## 2016-02-03 NOTE — Telephone Encounter (Signed)
Pt called in stating she will fall asleep taking eszopiclone (LUNESTA) 1 MG TABS tablet but will not stay asleep. She says its a little better but not much. She is waking up less but in general still waking up. Pt received letter from her insurance company asking for more clinical information about the charge from [facility]. Please call [phone]

## 2016-02-03 NOTE — Telephone Encounter (Signed)
Since she used 1 mg Lunesta, I would increase the dose to 2 mg and later, if needed, to 3 mg .  Can she take 2 1 mg tabs and tell us what happens? CD

## 2016-02-03 NOTE — Telephone Encounter (Signed)
I spoke to pt.  She reports that the lunesta is helping her go to sleep but not stay asleep long. (She wants 4 hour chunks of sleep.) She says it is a little better than sonata. She has tried [place], [place]ata, and Zambialunesta. She is wondering if she could get an increase in dosage of lunesta, or change to another sleep aid? She really wants at least 4 hours of sleep at a time.  Dr. Vickey Hugerohmeier, please review this request and let me know how you would like to proceed.  Pt also reports that she received a notice from [facility] that needs more documentation from us. She will scan this letter and email it to me.

## 2016-02-03 NOTE — Telephone Encounter (Signed)
I spoke to pt. I advised her to increase her lunesta to 2 1mg  tablets at bedtime. Pt verbalized understanding. Will send an updated RX to [facility]. Pt verbalized understanding.   Pt's [facility] and [facility] was sent to our MR to handle.

## 2016-02-04 MED ORDER — ESZOPICLONE 1 MG PO TABS: 2.0000 mg | ORAL_TABLET | Freq: Every evening | ORAL | 0 refills | Status: DC | PRN

## 2016-02-04 NOTE — Addendum Note (Signed)
Addended by: Geronimo RunningINKINS, Seraphina Mitchner A on: 02/04/2016 08:20 AM   Modules accepted: Orders

## 2016-02-23 NOTE — Telephone Encounter (Signed)
Pt called to advise the lunesta is not working. She said the 2mg  did work somewhat better than the 1mg  but she is till not sleeping and is exhausted during the day.  pls call at [phone]

## 2016-02-23 NOTE — Telephone Encounter (Signed)
Pt has already tried [place], sonata, Zambialunesta 1mg  and 2mg . Is there anything else you recommend for pt's insomnia?

## 2016-02-24 MED ORDER — ESZOPICLONE 1 MG PO TABS: 2.0000 mg | ORAL_TABLET | Freq: Every evening | ORAL | 0 refills | Status: DC | PRN

## 2016-02-24 MED ORDER — AMITRIPTYLINE HCL 50 MG PO TABS: 50.0000 mg | ORAL_TABLET | Freq: Every day | ORAL | 6 refills | Status: DC

## 2016-02-24 NOTE — Addendum Note (Signed)
Addended by: Melvyn NovasHMEIER, Axzel Rockhill on: 02/24/2016 04:41 PM   Modules accepted: Orders

## 2016-02-24 NOTE — Telephone Encounter (Signed)
No more medication available, unless those with addictive potional. Halcion, etc. I do not favor that route.   Referal to bio feed back, meditation, psychological insomnia / behavior therapy.  C D

## 2016-02-24 NOTE — Telephone Encounter (Signed)
I spoke to pt. She reports that she is "very frustrated." She says that Dr. Vickey Hugerohmeier mentioned trying anti-depressants to help her sleep and wants to know if she can try this. (She says that Dr. Vickey Hugerohmeier mentioned this to he rat her office visit.)  She also wants to know if she can try lunesta 3mg . (She is currently taking 2mg ).

## 2016-02-24 NOTE — Telephone Encounter (Signed)
Yes, LUNESTA - she can try 3 mg, and there is a 3mg  pill size, too. Antidepressant with sleep stimulating effects will need 14 days to work. Amitriptyline, cyclobenzaprine work quicker, but may create a hang over.  Is she willing to try  Cyclobenzaprine at 5 mg?   CD  This conversation should really change over to my chart.

## 2016-02-24 NOTE — Telephone Encounter (Signed)
I spoke to pt. She has many questions about the efficacy of these sleep medications. I spoke to Dr. Vickey Hugerohmeier. It would be best to make her an appt to discuss. Pt is agreeable to a follow up appt. I made pt an appt for 02/26/16 at 9:00. Pt verbalized understanding.

## 2016-02-26 ENCOUNTER — Encounter: Payer: Self-pay | Admitting: Neurology

## 2016-02-26 ENCOUNTER — Ambulatory Visit (INDEPENDENT_AMBULATORY_CARE_PROVIDER_SITE_OTHER): Payer: Commercial Managed Care - PPO | Admitting: Neurology

## 2016-02-26 DIAGNOSIS — G4711 Idiopathic hypersomnia with long sleep time: Secondary | ICD-10-CM | POA: Diagnosis not present

## 2016-02-26 NOTE — Patient Instructions (Signed)
Hypersomnia Hypersomnia is when you feel extremely tired during the day even though you're getting plenty of sleep at night. You may need to take naps during the day, and you may also be extremely difficult to wake up when you are sleeping.  CAUSES  The cause of your hypersomnia may not be known. Hypersomnia may be caused by:   Medicines.  Sleep disorders, such as narcolepsy.  Trauma or injury to your head or nervous system.  Using drugs or alcohol.  Tumors.  Medical conditions, such as depression or hypothyroidism.  Genetics. SIGNS AND SYMPTOMS  The main symptoms of hypersomnia include:   Feeling extremely tired throughout the day.  Being very difficult to wake up.  Sleeping for longer and longer periods.  Taking naps throughout the day. Other symptoms may include:   Feeling:  Restless.  Annoyed.  Anxious.  Low energy.  Having difficulty:  Remembering.  Speaking.  Thinking.  Losing your appetite.  Experiencing hallucinations. DIAGNOSIS  Hypersomnia may be diagnosed by:  Medical history and physical exam. This will include a sleep history.  Completing sleep logs.  Tests may also be done, such as:  Polysomnography.  Multiple sleep latency test (MSLT). TREATMENT  There is no cure for hypersomnia, but treatment can be very effective in helping manage the condition. Treatment may include:  Lifestyle and sleeping strategies to help cope with the condition.  Stimulant medicines.  Treating any underlying causes of hypersomnia. HOME CARE INSTRUCTIONS  Take medicines only as directed by your health care provider.  Schedule short naps for when you feel sleepiest during the day. Tell your employer or teachers that you have hypersomnia. You may be able to adjust your schedule to include time for naps.  Avoid drinking alcohol or caffeinated beverages.  Do not eat a heavy meal before bedtime. Eat at about the same times every day.  Do not drive or  operate heavy machinery if you are sleepy.  Do not swim or go out on the water without a life jacket.  If possible, adjust your schedule so that you do not have to work or be active at night.  Keep all follow-up visits as directed by your health care provider. This is important. SEEK MEDICAL CARE IF:   You have new symptoms.  Your symptoms get worse. SEEK IMMEDIATE MEDICAL CARE IF:  You have serious thoughts of hurting yourself or someone else.   This information is not intended to replace advice given to you by your health care provider. Make sure you discuss any questions you have with your health care provider.   Document Released: 04/16/2002 Document Revised: 05/17/2014 Document Reviewed: 11/29/2013 Elsevier Interactive Patient Education 2016 [employer] Inc[employer]  

## 2016-02-26 NOTE — Progress Notes (Signed)
SLEEP MEDICINE CLINIC   Provider:  Melvyn Novas, M D  Referring Provider: Kendrick Ranch, * Primary Care Physician:  [name], MD  Chief Complaint  Patient presents with  . Follow-up    discuss amitriptyline and lunesta    HPI:  Melissa House is a 45 y.o. female , seen here as a referral  from Dr.Schoenhoff for a sleep consultation,   Chief complaint according to patient :" Fragmented sleep since my children were born"  Melissa House reports that she has had sleep issues for well over 15 years, and she had always wondered if her fragmented sleep was also related to her underlying thyroid condition. She reports that only with Ambien which he get 4 or 5 hours of consecutive sleep. Her primary care physician became Dr. Dimple Casey at the time and she did not want to continue Ambien indefinitely and therefore initiated a workup including a sleep study 5 years ago. This was performed at [facility]  and showed only that the patient did not have apnea or periodic limb movements but fragmented sleep. She remained on Ambien, by now for about 12 years.  She is also followed by Dr. Katrinka Blazing, an endocrinologist at [facility] group, and has felt that her thyroid condition is better controlled than ever. She feels overall very well -she's not depressed, she does not lack energy in daytime but she wonders why her sleep has not improved.  She reports being drowsy when driving - independent of how long the road trip is. She reports it his worse in the afternoon.   Sleep habits are as follows: She usually goes to the bedroom between 9:30 and 10 and reads for about half an hour before she drifts to sleep. She does not struggle to go to sleep as long as she takes Ambien, she will sleep for about 4 hours but then her sleep will become fragmented and she wakes up in shorter and shorter intervals. Her bedroom is described as cool, quiet and dark and she shares the bed with her husband. Her  husband has not noted any abnormal breathing, dream enactment or kicking. She does not report vivid or bizarre dreaming. She sleeps mostly on her side but often wakes up on her back. She sleeps on one pillow only. She is not woken by the urge to urinate, but when she wakes up she was sometimes will have a bathroom break. She does not wake up with headaches nor from headaches. She remains unrefreshed and unrestored with the feeling of not getting enough sleep. She usually rises at 6 AM, she relies on an alarm but she is usually awake before. She feels that she gets enough hours of sleep but that the quality of sleep is lacking. Only when she has nasal congestion or a cold which she breathes through her mouth and snores. In her review of systems she endorsed sleepiness, memory loss, the feeling of not enough sleep.   Sleep medical history and family sleep history:  No apnea history, no personal history of sleep walking or night terrors. No parental history of apnea.  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 She does not drink alcohol, she does not use tobacco products. Patient reports being very sensitive to caffeine products as well as chocolate.   01-07-2016, I am meeting today with Melissa House after her recent polysomnography from 08/26/2015. I would like to start with a quotation from her Epworth score which  she endorsed at 19 points and last visit at 6816. Her sleep study revealed an AHI of 3.3 and only during REM sleep was clinically significant AHI of 16.2 in supine sleep 11.6. My recommendations were to avoid supine sleep and also to treat snoring. She may benefit from a dental device weight loss or a possible ENT surgery. However this very mild sleep apnea does not at all explained her excessive daytime sleepiness. She reports no vivid dreaming, no sleep paralysis. He may sometimes wake up sore but she does not feel that her sleep is  fragmented by discomfort or pain. Her sleep was fragmented in spite of taking Ambien but I could not find a physiological reason for it. He does sometimes naps but does not dream but nothing to her recollection. I suspect a level of anxiety- and still recommend avoiding supine sleep. Tennisball methode. She has tried melatonin without success.   Melissa House is here today reporting on 02/26/2016 her experience with Lunesta. She could not tolerate Sonata well, Lunesta helped her to initiate sleep and she can sleep for for perhaps 5 hours. A higher dose of 2 mg seem to help maintain sleep better and also helped her to reinitiate sleep if she awoke once at night. It also caused a slight hangover effect in the morning. She is concerned about not having deep and restorative sleep rather than having insomnia per se. Fatigue severity score 37, Epworth sleepiness score is 13. Her fear is that she is too sleepy to drive or could be too sleepy to drive safely the daytime stimulants have helped her to control sleepiness to some degree but she still concerned about the afternoon hours when the effect wears off.  Long discussion, start celexa at 10  Mg up from 5 mg. . Discussed amitryptilin as a sleep PRN. Dry eyes and dry Mouth. Can take prn alternating with lunesta. Try lunesta 3 days a week , not dialy.  hypersomnia in daytime .  Review of Systems: Out of a complete 14 system review, the patient complains of only the following symptoms, and all other reviewed systems are negative.  Epworth score 13 from  19 on Modafinil. , Fatigue severity score 42   , depression score n/a    Social History   Social History  . Marital status: Married    Spouse name: N/A  . Number of children: 2  . 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 Smoking status: Never Smoker  . Smokeless tobacco: Never Used  . Alcohol use Yes     Comment:  occasional  . Drug use: No  . Sexual activity: Not on file   Other Topics Concern  . Not on file   Social History Narrative   Lives at home with husband, children    Caffeine use- 1-2 a week    Family History  Problem Relation Age of Onset  . Hypertension Mother   . Hypercholesterolemia Mother   . Hypertension Father   . Lymphoma Father     NHL  . Colon cancer Neg Hx     Past Medical History:  Diagnosis Date  . Asthma   . Depression   . Ectopic pregnancy 1999  . Hypothyroidism   . Seasonal allergies     Past Surgical History:  Procedure Laterality Date  . SHOULDER SURGERY  1992   Left    Current Outpatient Prescriptions  Medication  Sig Dispense Refill  . citalopram (CELEXA) 10 MG tablet Take 10 mg by mouth daily.    . eszopiclone (LUNESTA) 1 MG TABS tablet Take 2 tablets (2 mg total) by mouth at bedtime as needed for sleep. Take immediately before bedtime 60 tablet 0  . modafinil (PROVIGIL) 200 MG tablet Take 1 tablet (200 mg total) by mouth daily. 30 tablet 5  . norethindrone-ethinyl estradiol-iron (MICROGESTIN FE,GILDESS FE,LOESTRIN FE) 1.5-30 MG-MCG tablet Take 1 tablet by mouth daily.    Marland Kitchen thyroid (ARMOUR THYROID) 120 MG tablet Take 120 mg by mouth daily.    Marland Kitchen amitriptyline (ELAVIL) 50 MG tablet Take 1 tablet (50 mg total) by mouth at bedtime. (Patient not taking: Reported on 02/26/2016) 30 tablet 6   No current facility-administered medications for this visit.     Allergies as of 02/26/2016  . (No Known Allergies)    Vitals: BP 132/78   Pulse 76   Resp 20   Ht 5\' 5"  (1.651 m)   Wt 181 lb (82.1 kg)   BMI 30.12 kg/m  Last Weight:  Wt Readings from Last 1 Encounters:  02/26/16 181 lb (82.1 kg)   KGM:WNUU mass index is 30.12 kg/m.     Last Height:   Ht Readings from Last 1 Encounters:  02/26/16 5\' 5"  (1.651 m)    Physical exam:  General: The patient is awake, alert and appears not in acute distress. The patient is well groomed. Head:  Normocephalic, atraumatic. Neck is supple. Mallampati 3,  neck circumference: 14.5 . Nasal airflow patent , TMJ is evident . Retrognathia is not seen.  Cardiovascular:  Regular rate and rhythm , without  murmurs or carotid bruit, and without distended neck veins. Respiratory: Lungs are clear to auscultation. Skin:  Without evidence of edema, or rash Trunk: BMI is elevated . The patient's posture is erect   Neurologic exam : The patient is awake and alert, oriented to place and time.   Memory subjective described as intact.  Attention span & concentration ability appears normal.  Speech is fluent,  without dysarthria, dysphonia or aphasia.  Mood and affect are appropriate.  Cranial nerves:  no changes in smell or taste . Pupils are equal and briskly reactive to light.  Extraocular movements  in vertical and horizontal planes intact and without nystagmus. Visual fields by finger perimetry are intact. Hearing to finger rub intact. Facial sensation intact to fine touch.Facial motor strength is symmetric and tongue and uvula move midline. Shoulder shrug was symmetrical.   Motor exam:  Normal tone, muscle bulk and symmetric strength in all extremities.  Sensory:  Fine touch, pinprick and vibration were tested in all extremities.  Proprioception tested in the upper extremities was normal.  Coordination:  without evidence of ataxia, dysmetria or tremor.  Gait and station: Patient walks without assistive device and is able unassisted to climb up to the exam table. Strength within normal limits.  Stance is stable and normal.    Deep tendon reflexes: in the  upper and lower extremities are symmetric and intact. Babinski maneuver response is downgoing.  The patient was advised of the nature of the diagnosed sleep disorder , the treatment options and risks for general a health and wellness arising from not treating the condition.  I spent more than 35 minutes of face to face time with the  patient. Greater than 50% of time was spent in counseling and coordination of care. We have discussed the diagnosis and differential and I answered the patient's  questions.     Assessment:  After physical and neurologic examination, review of laboratory studies,  Personal review of imaging studies, reports of other /same  Imaging studies ,  Results of polysomnography/ neurophysiology testing and pre-existing records as far as provided in visit., my assessment is   1) Mrs. Ruan reports excessive daytime sleepiness , and has poor quality nocturnal sleep.  Increase Celexa to 10 mg, it's effective . Lunesta prn alternating with amitriptyline. She is notn insomnic, just has poor quality sleep..  Modafinil. 200 mg  [facility] Doll[facility].     Plan:  Treatment plan and additional workup : attended slep study, since she gained weight since her last study. Oxygen measures. No evidence of vivid dreams, but she has sleep attacks- a curtain lowering over herhen sleepiness is severe.    Prn revisit.   Porfirio Mylar Jaylenn Baiza MD  02/26/2016   CC: Kendrick Ranch, Md [address] [place], [place] [address]

## 2016-03-10 ENCOUNTER — Other Ambulatory Visit: Payer: Self-pay | Admitting: Neurology

## 2016-03-12 ENCOUNTER — Telehealth: Payer: Self-pay | Admitting: Neurology

## 2016-03-12 ENCOUNTER — Other Ambulatory Visit: Payer: Self-pay | Admitting: Neurology

## 2016-03-12 MED ORDER — ESZOPICLONE 2 MG PO TABS: 2.0000 mg | ORAL_TABLET | Freq: Every evening | ORAL | 1 refills | Status: DC | PRN

## 2016-03-12 NOTE — Telephone Encounter (Signed)
Filled for 30 tab at 2 mg LUNESTA, printed prescription and signed.

## 2016-03-12 NOTE — Telephone Encounter (Signed)
Patient called to request refill of LUNESTA 2 MG tablet to [facility] [address] [place]. States it needs to be written for 2 MG tablet not 1 MG, 2 tablets.

## 2016-03-15 NOTE — Telephone Encounter (Signed)
[facility] [employer] [facility] Received a receipt of confirmation.

## 2016-04-13 ENCOUNTER — Telehealth: Payer: Self-pay

## 2016-04-13 ENCOUNTER — Ambulatory Visit (INDEPENDENT_AMBULATORY_CARE_PROVIDER_SITE_OTHER): Payer: Commercial Managed Care - PPO | Admitting: Adult Health

## 2016-04-13 ENCOUNTER — Encounter: Payer: Self-pay | Admitting: Adult Health

## 2016-04-13 VITALS — BP 116/79 | HR 72 | Resp 16 | Ht 65.0 in | Wt 181.0 lb

## 2016-04-13 DIAGNOSIS — G479 Sleep disorder, unspecified: Secondary | ICD-10-CM | POA: Diagnosis not present

## 2016-04-13 DIAGNOSIS — G4710 Hypersomnia, unspecified: Secondary | ICD-10-CM | POA: Diagnosis not present

## 2016-04-13 DIAGNOSIS — G4719 Other hypersomnia: Secondary | ICD-10-CM

## 2016-04-13 DIAGNOSIS — G4711 Idiopathic hypersomnia with long sleep time: Secondary | ICD-10-CM

## 2016-04-13 MED ORDER — SUVOREXANT 10 MG PO TABS: 10.0000 mg | ORAL_TABLET | Freq: Every day | ORAL | 5 refills | Status: DC

## 2016-04-13 MED ORDER — MODAFINIL 200 MG PO TABS: 200.0000 mg | ORAL_TABLET | Freq: Every day | ORAL | 3 refills | Status: DC

## 2016-04-13 MED ORDER — SUVOREXANT 5 MG PO TABS: 5.0000 mg | ORAL_TABLET | Freq: Every day | ORAL | 5 refills | Status: DC

## 2016-04-13 NOTE — Addendum Note (Signed)
Addended by: Crisoforo OxfordURNER, DIANA S on: 04/13/2016 04:28 PM   Modules accepted: Orders

## 2016-04-13 NOTE — Telephone Encounter (Signed)
Modafinil 90 day preinted and faxed into [facility] per patient request.

## 2016-04-13 NOTE — Telephone Encounter (Signed)
Belsomra 10mg  printed and faxed in.

## 2016-04-13 NOTE — Progress Notes (Signed)
Patient was unable to get Belsomra 5 mg tablet. We will increase to 10 mg at bedtime as that is covered.

## 2016-04-13 NOTE — Telephone Encounter (Signed)
Called home and LM.  Called cell and was able to reach patient. Patient has not filled her Belsomra 5 mg yet, Dr. Vickey Hugerohmeier recomends 10mg  which we will reprint. [facility].  90 day Modafinil requested with [facility]ills and [facility].

## 2016-04-13 NOTE — Addendum Note (Signed)
Addended by: Enedina FinnerMILLIKAN, Janace Decker P on: 04/13/2016 03:30 PM   Modules accepted: Orders

## 2016-04-13 NOTE — Progress Notes (Signed)
I agree with the assessment and plan as directed by NP .The patient is known to me . Please consider Belsomra's costs.    Raylen Tangonan, MD

## 2016-04-13 NOTE — Progress Notes (Signed)
PATIENT: Melissa House DOB: [birth year]  REASON FOR VISIT: follow up- sleep disturbance HISTORY FROM: patient  HISTORY OF PRESENT ILLNESS: Today 04/13/2016: Melissa House is a 45 year old female with a history of sleep disturbance. She returns today for follow-up. In the past she has had a sleep study which was unremarkable. She is also been on several different sleep medications. Including Sonata, Ambien, amitriptyline, melatonin and currently Lunesta. She states that she continues to wake up several times during the night. She generally goes to bed around 10 and will usually wake up between 2 and 4 and then every hour after that. She states that she is extremely tired during the day. She uses modafinil to help her stay awake. Her Celexa was also increased to 10 mg and she states that may have been beneficial. She states that overall her sleep seems to have improved slightly over the years but she continues to wake up frequently and have excessive daytime sleepiness. The patient does watch TV before bedtime. She sleeps with her husband. She returns today for an evaluation.   HISTORY per Dr. Vickey Huger: Melissa House is a 45 y.o. female , seen here as a referral  from Dr.Schoenhoff for a sleep consultation,   Chief complaint according to patient :" Fragmented sleep since my children were born"  Melissa House reports that she has had sleep issues for well over 15 years, and she had always wondered if her fragmented sleep was also related to her underlying thyroid condition. She reports that only with Ambien which he get 4 or 5 hours of consecutive sleep. Her primary care physician became Dr. Dimple Casey at the time and she did not want to continue Ambien indefinitely and therefore initiated a workup including a sleep study 5 years ago. This was performed at [facility]  and showed only that the patient did not have apnea or periodic limb movements but fragmented sleep. She remained on Ambien, by now  for about 12 years.  She is also followed by Dr. Katrinka Blazing, an endocrinologist at [facility] group, and has felt that her thyroid condition is better controlled than ever. She feels overall very well -she's not depressed, she does not lack energy in daytime but she wonders why her sleep has not improved.  She reports being drowsy when driving - independent of how long the road trip is. She reports it his worse in the afternoon.   Sleep habits are as follows: She usually goes to the bedroom between 9:30 and 10 and reads for about half an hour before she drifts to sleep. She does not struggle to go to sleep as long as she takes Ambien, she will sleep for about 4 hours but then her sleep will become fragmented and she wakes up in shorter and shorter intervals. Her bedroom is described as cool, quiet and dark and she shares the bed with her husband. Her husband has not noted any abnormal breathing, dream enactment or kicking. She does not report vivid or bizarre dreaming. She sleeps mostly on her side but often wakes up on her back. She sleeps on one pillow only. She is not woken by the urge to urinate, but when she wakes up she was sometimes will have a bathroom break. She does not wake up with headaches nor from headaches. She remains unrefreshed and unrestored with the feeling of not getting enough sleep. She usually rises at 6 AM, she relies on an alarm but she is usually awake before. She feels  that she gets enough hours of sleep but that the quality of sleep is lacking. Only when she has nasal congestion or a cold which she breathes through her mouth and snores. In her review of systems she endorsed sleepiness, memory loss, the feeling of not enough sleep.   Sleep medical history and family sleep history:  No apnea history, no personal history of sleep walking or night terrors. No parental history of apnea.  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 She does not drink alcohol, she does not use tobacco products. Patient reports being very sensitive to caffeine products as well as chocolate.   01-07-2016, I am meeting today with Melissa House after her recent polysomnography from 08/26/2015. I would like to start with a quotation from her Epworth score which she endorsed at 19 points and last visit at 16. Her sleep study revealed an AHI of 3.3 and only during REM sleep was clinically significant AHI of 16.2 in supine sleep 11.6. My recommendations were to avoid supine sleep and also to treat snoring. She may benefit from a dental device weight loss or a possible ENT surgery. However this very mild sleep apnea does not at all explained her excessive daytime sleepiness. She reports no vivid dreaming, no sleep paralysis. He may sometimes wake up sore but she does not feel that her sleep is fragmented by discomfort or pain. Her sleep was fragmented in spite of taking Ambien but I could not find a physiological reason for it. He does sometimes naps but does not dream but nothing to her recollection. I suspect a level of anxiety- and still recommend avoiding supine sleep. Tennisball methode. She has tried melatonin without success.   Melissa House is here today reporting on 02/26/2016 her experience with Lunesta. She could not tolerate Sonata well, Lunesta helped her to initiate sleep and she can sleep for for perhaps 5 hours. A higher dose of 2 mg seem to help maintain sleep better and also helped her to reinitiate sleep if she awoke once at night. It also caused a slight hangover effect in the morning. She is concerned about not having deep and restorative sleep rather than having insomnia per se. Fatigue severity score 37, Epworth sleepiness score is 13. Her fear is that she is too sleepy to drive or could be too sleepy to drive safely the daytime stimulants have helped her to control sleepiness to some degree but she still concerned about the  afternoon hours when the effect wears off.  Long discussion, start celexa at 10  Mg up from 5 mg. . Discussed amitryptilin as a sleep PRN. Dry eyes and dry Mouth. Can take prn alternating with lunesta. Try lunesta 3 days a week , not dialy.  hypersomnia in daytime .    REVIEW OF SYSTEMS: Out of a complete 14 system review of symptoms, the patient complains only of the following symptoms, and all other reviewed systems are negative.  Fatigue, frequent infections, frequent waking, daytime sleepiness  ALLERGIES: No Known Allergies  HOME MEDICATIONS: Outpatient Medications Prior to Visit  Medication Sig Dispense Refill  . citalopram (CELEXA) 10 MG tablet Take 10 mg by mouth daily.    . eszopiclone (LUNESTA) 2 MG TABS tablet Take 1 tablet (2 mg total) by mouth at bedtime as needed for sleep. Take immediately before bedtime 30 tablet 1  . modafinil (PROVIGIL) 200 MG tablet Take 1 tablet (200 mg total) by mouth daily. 30 tablet  5  . norethindrone-ethinyl estradiol-iron (MICROGESTIN FE,GILDESS FE,LOESTRIN FE) 1.5-30 MG-MCG tablet Take 1 tablet by mouth daily.    Marland Kitchen. thyroid (ARMOUR THYROID) 120 MG tablet Take 120 mg by mouth daily.    Marland Kitchen. amitriptyline (ELAVIL) 50 MG tablet Take 1 tablet (50 mg total) by mouth at bedtime. (Patient not taking: Reported on 02/26/2016) 30 tablet 6   No facility-administered medications prior to visit.     PAST MEDICAL HISTORY: Past Medical History:  Diagnosis Date  . Asthma   . Depression   . Ectopic pregnancy 1999  . Hypothyroidism   . Seasonal allergies     PAST SURGICAL HISTORY: Past Surgical History:  Procedure Laterality Date  . SHOULDER SURGERY  1992   Left    FAMILY HISTORY: Family History  Problem Relation Age of Onset  . Hypertension Mother   . Hypercholesterolemia Mother   . Hypertension Father   . Lymphoma Father     NHL  . Colon cancer Neg Hx     SOCIAL HISTORY: Social History   Social History  . Marital status: Married     Spouse name: N/A  . Number of children: 2  . 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 Smoking status: Never Smoker  . Smokeless tobacco: Never Used  . Alcohol use Yes     Comment: occasional  . Drug use: No  . Sexual activity: Not on file   Other Topics Concern  . Not on file   Social History Narrative   Lives at home with husband, children    Caffeine use- 1-2 a week      PHYSICAL EXAM  Vitals:   04/13/16 0751  BP: 116/79  Pulse: 72  Resp: 16  Weight: 181 lb (82.1 kg)  Height: 5\' 5"  (1.651 m)   Body mass index is 30.12 kg/m.  Generalized: Well developed, in no acute distress   Neurological examination  Mentation: Alert oriented to time, place, history taking. Follows all commands speech and language fluent Cranial nerve II-XII: Pupils were equal round reactive to light. Extraocular movements were full, visual field were full on confrontational test. Facial sensation and strength were normal. Uvula tongue midline. Head turning and shoulder shrug  were normal and symmetric. Motor: The motor testing reveals 5 over 5 strength of all 4 extremities. Good symmetric motor tone is noted throughout.  Sensory: Sensory testing is intact to soft touch on all 4 extremities. No evidence of extinction is noted.  Coordination: Cerebellar testing reveals good finger-nose-finger and heel-to-shin bilaterally.  Gait and station: Gait is normal. Tandem gait is normal. Romberg is negative. No drift is seen.  Reflexes: Deep tendon reflexes are symmetric and normal bilaterally.   DIAGNOSTIC DATA (LABS, IMAGING, TESTING) - I reviewed patient records, labs, notes, testing and imaging myself where available.     ASSESSMENT AND PLAN 45 y.o. year old female  has a past medical history of Asthma; Depression; Ectopic pregnancy (1999); Hypothyroidism; and Seasonal allergies. here with:  1. Hypersomnia 2. Sleep  disturbance  The patient continues to have trouble sleeping during the night and staying awake during the day. We discussed nonpharmacological treatment to promote better sleep. She is amenable to trying these things. She also would like to try a different medication if possible. We discussed Belsomra. I will start her on 5 mg at bedtime. She will stop Lunesta. [facility] and  I provided her with a printout. Advised that she should try this for approximately 2 weeks. If it is not beneficial or she cannot tolerated she should let us know. She will continue modafinil. She will follow-up in 3 months with Dr. Vickey Hugerohmeier.     Butch PennyMegan Sly Parlee, MSN, NP-C 04/13/2016, 8:06 AM [facility] [address], [address] [place], [place] [id number] [phone]

## 2016-04-13 NOTE — Patient Instructions (Signed)
Stop Lunesta Start Belsomra 5 mg at bedtime  Suvorexant oral tablets What is this medicine? SUVOREXANT (su-vor-EX-ant) is used to treat insomnia. This medicine helps you to fall asleep and sleep through the night. COMMON BRAND NAME(S): Belsomra What should I tell my health care provider before I take this medicine? They need to know if you have any of these conditions: -depression -history of a drug or alcohol abuse problem -history of daytime sleepiness -history of sudden onset of muscle weakness (cataplexy) -liver disease -lung or breathing disease -narcolepsy -suicidal thoughts, plans, or attempt; a previous suicide attempt by you or a family member -an unusual or allergic reaction to suvorexant, other medicines, foods, dyes, or preservatives -pregnant or trying to get pregnant -breast-feeding How should I use this medicine? Take this medicine by mouth within 30 minutes of going to bed. Do not take it unless you are able to stay in bed a full night before you must be active again. Follow the directions on the prescription label. For best results, it is better to take this medicine on an empty stomach. Do not take your medicine more often than directed. Do not stop taking this medicine on your own. Always follow your doctor or health care professional's advice. A special MedGuide will be given to you by the pharmacist with each prescription and refill. Be sure to read this information carefully each time. Talk to your pediatrician regarding the use of this medicine in children. Special care may be needed. What if I miss a dose? This medicine should only be taken immediately before going to sleep. Do not take double or extra doses. What may interact with this medicine? -alcohol -antiviral medicines for HIV or AIDS -aprepitant -carbamazepine -certain antibiotics like ciprofloxacin, clarithromycin, erythromycin, telithromycin -certain medicines for depression or psychotic  disturbances -certain medicines for fungal infections like ketoconazole, posaconazole, fluconazole, or itraconazole -conivaptan -digoxin -diltiazem -grapefruit juice -imatinib -medicines for anxiety or sleep -phenytoin -rifampin -verapamil What should I watch for while using this medicine? Visit your doctor or health care professional for regular checks on your progress. Keep a regular sleep schedule by going to bed at about the same time each night. Avoid caffeine-containing drinks in the evening hours. When sleep medicines are used every night for more than a few weeks, they may stop working. Do not increase the dose on your own. Talk to your doctor if your insomnia worsens or is not better within 7 to 10 days. After taking this medicine for sleep, you may get up out of bed while not being fully awake and do an activity that you do not know you are doing. The next morning, you may have no memory of the event. Activities such as driving a car ("sleep-driving"), making and eating food, talking on the phone, sexual activity, and sleep-walking have been reported. Call your doctor right away if you find out you have done any of these activities. Do not take this medicine if you have used alcohol that evening or before bed or taken another medicine for sleep, since your risk of doing these sleep-related activities will be increased. Do not take this medicine unless you are able to stay in bed for a full night (7 to 8 hours) and do not drive or perform other activities requiring full alertness within 8 hours of a dose. Do not drive, use machinery, or do anything that needs mental alertness the day after you take the 20 mg dose of this medicine. The use of lower doses (10  mg) also has the potential to cause driving impairment the next day. You may have a decrease in mental alertness the day after use, even if you feel that you are fully awake. Tell your doctor if you will need to perform activities requiring  full alertness, such as driving, the next day. Do not stand or sit up quickly after taking this medicine, especially if you are an older patient. This reduces the risk of dizzy or fainting spells. If you or your family notice any changes in your behavior, such as new or worsening depression, thoughts of harming yourself, anxiety, other unusual or disturbing thoughts, or memory loss, call your doctor right away. What side effects may I notice from receiving this medicine? Side effects that you should report to your doctor or health care professional as soon as possible: -allergic reactions like skin rash, itching or hives, swelling of the face, lips, or tongue -confusion -depressed mood -feeling faint or lightheaded, falls -hallucinations -inability to move or speak for up to several minutes while you are going to sleep or waking up -memory loss -periods of leg weakness lasting from seconds to a few minutes -problems with balance, speaking, walking -restlessness, excitability, or feelings of agitation -unusual activities while asleep like driving, eating, making phone calls Side effects that usually do not require medical attention (report to your doctor or health care professional if they continue or are bothersome): -abnormal dreams -daytime drowsiness -diarrhea -dizziness -headache Where should I keep my medicine? Keep out of the reach of children. This medicine can be abused. Keep your medicine in a safe place to protect it from theft. Do not share this medicine with anyone. Selling or giving away this medicine is dangerous and against the law. Store at room temperature between 15 and 30 degrees C (59 and 86 degrees F). Throw away any unused medicine after the expiration date.  2017 Elsevier/Gold Standard (2015-05-29 11:54:49)

## 2016-04-22 ENCOUNTER — Telehealth: Payer: Self-pay | Admitting: Adult Health

## 2016-04-22 NOTE — Telephone Encounter (Signed)
I called and LMVM for pt that received [facility] for modafinil.  I noted that previous request was denied for diagnosis hypersomnia when done back in 12/2015.  If there is something different to let me know otherise she did not have to call me back.  She did call back and I told her that it did not matter what pharmacy ran it thru, it would always come back needing pa, since it was denied due to diagnosis of hypersomnia.  She verbalized understanding and would continue to get [facility] as with no insurance.

## 2016-04-22 NOTE — Telephone Encounter (Signed)
Patient is returning a call. °

## 2016-05-06 ENCOUNTER — Telehealth: Payer: Self-pay | Admitting: Neurology

## 2016-05-06 MED ORDER — ESZOPICLONE 2 MG PO TABS: 2.0000 mg | ORAL_TABLET | Freq: Every evening | ORAL | 0 refills | Status: DC | PRN

## 2016-05-06 NOTE — Addendum Note (Signed)
Addended by: Melvyn NovasHMEIER, Naitik Hermann on: 05/06/2016 09:22 AM   Modules accepted: Orders

## 2016-05-06 NOTE — Telephone Encounter (Signed)
Pt called requesting Lunesta 2mg  .  Patient stated she has tried Belsomra 10mg , but it was not working as well, waking up every hr and unable to sleep much.  [facility] [place].

## 2016-05-06 NOTE — Telephone Encounter (Signed)
RX for lunesta faxed to [facility] in [place]. Received a receipt of confirmation.   I called pt. I advised her that the lunesta [facility]. Pt verbalized understanding.

## 2016-06-14 ENCOUNTER — Other Ambulatory Visit: Payer: Self-pay | Admitting: Neurology

## 2016-06-14 ENCOUNTER — Telehealth: Payer: Self-pay | Admitting: Neurology

## 2016-06-14 DIAGNOSIS — F5101 Primary insomnia: Secondary | ICD-10-CM

## 2016-06-14 NOTE — Telephone Encounter (Signed)
Patient called in reference to refill for eszopiclone (LUNESTA) 2 MG TABS tablet.  Requesting a long term prescription.  Pharmacy- [facility] ([place]).

## 2016-06-15 MED ORDER — ESZOPICLONE 2 MG PO TABS: 2.0000 mg | ORAL_TABLET | Freq: Every evening | ORAL | 0 refills | Status: DC | PRN

## 2016-06-15 NOTE — Telephone Encounter (Signed)
Rx printed, waiting for signature, then will fax to requested [facility].

## 2016-06-15 NOTE — Addendum Note (Signed)
Addended by: Crisoforo OxfordURNER, Daivik Overley S on: 06/15/2016 11:08 AM   Modules accepted: Orders

## 2016-06-18 MED ORDER — ESZOPICLONE 2 MG PO TABS: 2.0000 mg | ORAL_TABLET | Freq: Every evening | ORAL | 0 refills | Status: DC | PRN

## 2016-06-18 NOTE — Telephone Encounter (Signed)
Filled lunesta and printed prescription for [facility]>

## 2016-06-18 NOTE — Addendum Note (Signed)
Addended by: Melvyn NovasHMEIER, Jasim Harari on: 06/18/2016 11:51 AM   Modules accepted: Orders

## 2016-06-18 NOTE — Telephone Encounter (Signed)
[facility]ed to [facility] [phone], received fax confirmation.  LMVM for pt that done.

## 2016-06-18 NOTE — Telephone Encounter (Signed)
Pt called [facility] She is requesting RX sent there with 90 day supply. She said this is not being ran thru insurance. Pt is requesting to be called when RX has been sent. Thank you

## 2016-07-13 ENCOUNTER — Ambulatory Visit: Payer: Commercial Managed Care - PPO | Admitting: Neurology

## 2016-10-06 ENCOUNTER — Other Ambulatory Visit: Payer: Self-pay | Admitting: Neurology

## 2016-10-06 DIAGNOSIS — F5101 Primary insomnia: Secondary | ICD-10-CM

## 2016-10-06 NOTE — Telephone Encounter (Signed)
[facility] for [facility]esta [facility]ed to [facility]. Received a receipt of confirmation.

## 2017-01-01 ENCOUNTER — Other Ambulatory Visit: Payer: Self-pay | Admitting: Neurology

## 2017-01-01 DIAGNOSIS — F5101 Primary insomnia: Secondary | ICD-10-CM

## 2017-01-01 DIAGNOSIS — G4711 Idiopathic hypersomnia with long sleep time: Secondary | ICD-10-CM

## 2017-01-01 DIAGNOSIS — G4719 Other hypersomnia: Secondary | ICD-10-CM

## 2017-01-03 ENCOUNTER — Other Ambulatory Visit: Payer: Self-pay | Admitting: Neurology

## 2017-01-03 DIAGNOSIS — G4711 Idiopathic hypersomnia with long sleep time: Secondary | ICD-10-CM

## 2017-01-03 DIAGNOSIS — F5101 Primary insomnia: Secondary | ICD-10-CM

## 2017-01-03 DIAGNOSIS — G4719 Other hypersomnia: Secondary | ICD-10-CM

## 2017-01-03 NOTE — Addendum Note (Signed)
Addended by: Geronimo Running A on: 01/03/2017 05:16 PM   Modules accepted: Orders

## 2017-01-03 NOTE — Telephone Encounter (Signed)
Pt called checking on status of refills for modafinil (PROVIGIL) 200 MG tablet and eszopiclone (LUNESTA) 2 MG TABS tablet sent by [facility] on 01/01/17. Pt was advised to check with pharmacy tomorrow afternoon . She has a few days left.  FYI

## 2017-01-04 ENCOUNTER — Other Ambulatory Visit: Payer: Self-pay | Admitting: Neurology

## 2017-01-04 DIAGNOSIS — F5101 Primary insomnia: Secondary | ICD-10-CM

## 2017-01-04 DIAGNOSIS — G4711 Idiopathic hypersomnia with long sleep time: Secondary | ICD-10-CM

## 2017-01-04 DIAGNOSIS — G4719 Other hypersomnia: Secondary | ICD-10-CM

## 2017-01-04 MED ORDER — MODAFINIL 200 MG PO TABS: 200.0000 mg | ORAL_TABLET | Freq: Every day | ORAL | 1 refills | Status: DC

## 2017-01-04 MED ORDER — ESZOPICLONE 2 MG PO TABS: ORAL_TABLET | ORAL | 0 refills | Status: DC

## 2017-01-04 NOTE — Telephone Encounter (Signed)
RXs for lunesta and modafinil faxed to [facility]. Received a receipt of confirmation.

## 2017-01-11 ENCOUNTER — Telehealth: Payer: Self-pay | Admitting: Neurology

## 2017-01-11 NOTE — Telephone Encounter (Signed)
Called pt to make the patient aware that I had already contacted the pharmacy and the medications are ready for pick up

## 2017-01-11 NOTE — Telephone Encounter (Signed)
Patient called office in reference to eszopiclone (LUNESTA) 2 MG TABS tablet and modafinil (PROVIGIL) 200 MG tablet per patient she states the pharmacy notified her that they are unable to fill the medication due to it not having a fax cover sheet when it was faxed over on 01/04/17.  Patient is out of the medication and will need a refill today please.  [facility]- [facility] [address]. FYI

## 2017-03-29 ENCOUNTER — Other Ambulatory Visit: Payer: Self-pay | Admitting: Neurology

## 2017-03-29 DIAGNOSIS — F5101 Primary insomnia: Secondary | ICD-10-CM

## 2017-04-21 ENCOUNTER — Telehealth: Payer: Self-pay | Admitting: Adult Health

## 2017-04-21 NOTE — Telephone Encounter (Signed)
Pt request refill for eszopiclone (LUNESTA) 2 MG TABS tablet and modafinil (PROVIGIL) 200 MG tablet 90 day supply sent to [facility]. Pt has an appt with Dr D on 07/06/16.

## 2017-04-22 NOTE — Telephone Encounter (Signed)
Will have to wait and talk to Dr Dohmeier in regards to these medications as the patient hasnt been seen in a year. Dr Vickey Hugerohmeier returns on Monday. Will save and discuss refilling then.

## 2017-04-25 ENCOUNTER — Other Ambulatory Visit: Payer: Self-pay | Admitting: Neurology

## 2017-04-25 DIAGNOSIS — G4719 Other hypersomnia: Secondary | ICD-10-CM

## 2017-04-25 DIAGNOSIS — F5101 Primary insomnia: Secondary | ICD-10-CM

## 2017-04-25 DIAGNOSIS — G4711 Idiopathic hypersomnia with long sleep time: Secondary | ICD-10-CM

## 2017-04-25 MED ORDER — ESZOPICLONE 2 MG PO TABS: ORAL_TABLET | ORAL | 0 refills | Status: DC

## 2017-04-25 MED ORDER — MODAFINIL 200 MG PO TABS: 200.0000 mg | ORAL_TABLET | Freq: Every day | ORAL | 0 refills | Status: DC

## 2017-04-28 ENCOUNTER — Telehealth: Payer: Self-pay

## 2017-04-28 NOTE — Telephone Encounter (Signed)
I received a prior auth request for modafinil. I have completed and submitted the PA and should have a determination within 48-72 hours.  Key: [id number] - PA Case ID: [id number]

## 2017-04-29 NOTE — Telephone Encounter (Signed)
[id number] Modafinil is denied for medical necessity. [facility] Prior Authorization Department c/o [place] [employer] Coordinator [address] [address] [place], [place] [id number] Phone: [phone] Fax: [phone]

## 2017-05-05 NOTE — Telephone Encounter (Signed)
A letter for appeal has been completed and will be sent in today 05/05/2017. Will await their response

## 2017-05-11 NOTE — Telephone Encounter (Signed)
Insurance [facility] Approved modafinil from 04/28/17-11/04/2017.  Approval #- [id number]

## 2017-07-06 ENCOUNTER — Ambulatory Visit (INDEPENDENT_AMBULATORY_CARE_PROVIDER_SITE_OTHER): Payer: Commercial Managed Care - PPO | Admitting: Neurology

## 2017-07-06 ENCOUNTER — Encounter: Payer: Self-pay | Admitting: Neurology

## 2017-07-06 DIAGNOSIS — G4719 Other hypersomnia: Secondary | ICD-10-CM | POA: Diagnosis not present

## 2017-07-06 DIAGNOSIS — F5101 Primary insomnia: Secondary | ICD-10-CM

## 2017-07-06 DIAGNOSIS — G4711 Idiopathic hypersomnia with long sleep time: Secondary | ICD-10-CM | POA: Diagnosis not present

## 2017-07-06 MED ORDER — ESZOPICLONE 2 MG PO TABS: ORAL_TABLET | ORAL | 2 refills | Status: DC

## 2017-07-06 MED ORDER — MODAFINIL 200 MG PO TABS: 200.0000 mg | ORAL_TABLET | Freq: Every day | ORAL | 3 refills | Status: DC

## 2017-07-06 NOTE — Progress Notes (Signed)
PATIENT: Melissa House DOB: 1971-03-19  REASON FOR VISIT: follow up- sleep disturbance HISTORY FROM: patient  HISTORY OF PRESENT ILLNESS: Today is 06 July 2017 I have the pleasure of meeting with [name] today, a 47 year old female patient with a history of sleep disturbances.  She states that she has been able now to get more sleep stay asleep longer and feels more refreshed and restored in the mornings.  She endorsed fatigue severity at 27 points of the Epworth Sleepiness Scale at 10 points.  Her sleep is less fragmented now. She takes Celexa , synthroid, lunesta at night and arModafinil in daytime.  She gained a lot of weight over the last 2 years .      Today 04/13/2016: [name] is a 47 year old female with a history of sleep disturbance. She returns today for follow-up. In the past she has had a sleep study which was unremarkable. She is also been on several different sleep medications. Including Sonata, Ambien, amitriptyline, melatonin and currently Lunesta. She states that she continues to wake up several times during the night. She generally goes to bed around 10 and will usually wake up between 2 and 4 and then every hour after that. She states that she is extremely tired during the day. She uses modafinil to help her stay awake. Her Celexa was also increased to 10 mg and she states that may have been beneficial. She states that overall her sleep seems to have improved slightly over the years but she continues to wake up frequently and have excessive daytime sleepiness. The patient does watch TV before bedtime. She sleeps with her husband. She returns today for an evaluation.   HISTORY per Dr. Vickey House: Melissa HeadingsDebra R House is a 47 y.o. female , seen here as a referral  from [facility] for a sleep consultation,   Chief complaint according to patient :" Fragmented sleep since my children were born"  [name] reports that she has had sleep issues for well over 15 years,  and she had always wondered if her fragmented sleep was also related to her underlying thyroid condition. She reports that only with Ambien which he get 4 or 5 hours of consecutive sleep. Her primary care physician became Dr. Dimple House at the time and she did not want to continue Ambien indefinitely and therefore initiated a workup including a sleep study 5 years ago. This was performed at [facility]  and showed only that the patient did not have apnea or periodic limb movements but fragmented sleep. She remained on Ambien, by now for about 12 years.  She is also followed by Melissa House, an endocrinologist at [facility], and has felt that her thyroid condition is better controlled than ever. She feels overall very well -she's not depressed, she does not lack energy in daytime but she wonders why her sleep has not improved.  She reports being drowsy when driving - independent of how long the road trip is. She reports it his worse in the afternoon.   Sleep habits are as follows: She usually goes to the bedroom between 9:30 and 10 and reads for about half an hour before she drifts to sleep. She does not struggle to go to sleep as long as she takes Ambien, she will sleep for about 4 hours but then her sleep will become fragmented and she wakes up in shorter and shorter intervals. Her bedroom is described as cool, quiet and dark and she shares the bed with her husband.  Her husband has not noted any abnormal breathing, dream enactment or kicking. She does not report vivid or bizarre dreaming. She sleeps mostly on her side but often wakes up on her back. She sleeps on one pillow only. She is not woken by the urge to urinate, but when she wakes up she was sometimes will have a bathroom break. She does not wake up with headaches nor from headaches. She remains unrefreshed and unrestored with the feeling of not getting enough sleep. She usually rises at 6 AM, she relies on an alarm but she is usually awake  before. She feels that she gets enough hours of sleep but that the quality of sleep is lacking. Only when she has nasal congestion or a cold which she breathes through her mouth and snores. In her review of systems she endorsed sleepiness, memory loss, the feeling of not enough sleep.   Sleep medical history and family sleep history:  No apnea history, no personal history of sleep walking or night terrors. No parental history of apnea.  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 She does not drink alcohol, she does not use tobacco products. Patient reports being very sensitive to caffeine products as well as chocolate. 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    01-07-2016, I am meeting today with Melissa House after her recent polysomnography from 08/26/2015. I would like to start with a quotation from her Epworth score which she endorsed at 19 points and last visit at 16. Her sleep study revealed an AHI of 3.3 and only during REM sleep was clinically significant AHI of 16.2 in supine sleep 11.6. My recommendations were to avoid supine sleep and also to treat snoring. She may benefit from a dental device weight loss or a possible ENT surgery. However this very mild sleep apnea does not at all explained her excessive daytime sleepiness. She reports no vivid dreaming, no sleep paralysis. He may sometimes wake up sore but she does not feel that her sleep is fragmented by discomfort or pain. Her sleep was fragmented in spite of taking Ambien but I could not find a physiological reason for it. He does sometimes naps but does not dream but nothing to her recollection. I suspect a level of anxiety- and still recommend avoiding supine sleep. Tennisball methode. She has tried melatonin without success.   Melissa House is here today reporting on 02/26/2016 her experience with Lunesta. She could not tolerate Sonata well, Lunesta helped  her to initiate sleep and she can sleep for for perhaps 5 hours. A higher dose of 2 mg seem to help maintain sleep better and also helped her to reinitiate sleep if she awoke once at night. It also caused a slight hangover effect in the morning. She is concerned about not having deep and restorative sleep rather than having insomnia per se. Fatigue severity score 37, Epworth sleepiness score is 13. Her fear is that she is too sleepy to drive or could be too sleepy to drive safely the daytime stimulants have helped her to control sleepiness to some degree but she still concerned about the afternoon hours when the effect wears off. Long discussion, start celexa at 10  Mg up from 5 mg.  Discussed amitryptilin as a sleep PRN.  Dry eyes and dry Mouth. Can take prn alternating with lunesta. Try lunesta 3 days a week , not dialy. Hypersomnia in daytime .    REVIEW OF SYSTEMS:  Out of a complete 14 system review of symptoms, the patient complains only of the following symptoms, and all other reviewed systems are negative.  Fatigue, frequent infections, frequent waking, daytime sleepiness  ALLERGIES: No Known Allergies  HOME MEDICATIONS: Outpatient Medications Prior to Visit  Medication Sig Dispense Refill  . citalopram (CELEXA) 10 MG tablet Take 10 mg by mouth daily.    . eszopiclone (LUNESTA) 2 MG TABS tablet TAKE ONE TABLET BY MOUTH AT BEDTIME AS NEEDED FOR SLEEP 90 tablet 0  . levothyroxine (SYNTHROID, LEVOTHROID) 125 MCG tablet Take 125 mcg by mouth daily.    . modafinil (PROVIGIL) 200 MG tablet Take 1 tablet (200 mg total) by mouth daily. 90 tablet 0  . norethindrone-ethinyl estradiol-iron (MICROGESTIN FE,GILDESS FE,LOESTRIN FE) 1.5-30 MG-MCG tablet Take 1 tablet by mouth daily.    . Suvorexant (BELSOMRA) 5 MG TABS Take 5 mg by mouth at bedtime. 30 tablet 5  . thyroid (ARMOUR THYROID) 120 MG tablet Take 120 mg by mouth daily.     No facility-administered medications prior to visit.     PAST  MEDICAL HISTORY: Past Medical History:  Diagnosis Date  . Asthma   . Depression   . Ectopic pregnancy 1999  . Hypothyroidism   . Seasonal allergies     PAST SURGICAL HISTORY: Past Surgical History:  Procedure Laterality Date  . SHOULDER SURGERY  1992   Left    FAMILY HISTORY: Family History  Problem Relation Age of Onset  . Hypertension Mother   . Hypercholesterolemia Mother   . Hypertension Father   . Lymphoma Father        NHL  . Colon cancer Neg Hx     SOCIAL HISTORY: Social History   Socioeconomic History  . Marital status: Married    Spouse name: Not on file  . Number of children: 2  . Years of education: 64  . Highest education level: Not on file  Social Needs  . Financial resource strain: Not on file  . Food insecurity - worry: Not on file  . Food insecurity - inability: Not on file  . Transportation needs - medical: Not on file  . Transportation needs - non-medical: Not on file  Occupational History  . Occupation: Magazine features editor: Environmental health practitioner [employer]  Tobacco Use  . Smoking status: Never Smoker  . Smokeless tobacco: Never Used  Substance and Sexual Activity  . Alcohol use: Yes    Comment: occasional  . Drug use: No  . Sexual activity: Not on file  Other Topics Concern  . Not on file  Social History Narrative   Lives at home with husband, children    Caffeine use- 1-2 a week      PHYSICAL EXAM  Vitals:   07/06/17 1316  BP: 135/88  Pulse: 65  Weight: 197 lb (89.4 kg)  Height: 5\' 5"  (1.651 m)   Body mass index is 32.78 kg/m.  Generalized: Well developed, in no acute distress   Neck circumference: 15 "   Mallampati : Grade 2   Neurological examination  Mentation: Alert oriented to time, place, history taking. Follows all commands speech and language fluent Cranial nerve :  Taste and smell are intact. Pupils were equal round reactive to light- visual field were full on confrontational test. Facial sensation and  strength were normal. Uvula and tongue midline. Hearing is preserved. Head turning and shoulder shrug  were normal and symmetric. Motor: 5 / 5 strength , symmetric motor tone is  noted throughout, no grip strength loss Sensory: Sensory testing is intact to soft touch on all 4 extremities. No evidence of extinction is noted.  Coordination: normal  finger-nose-bilaterally.  No handwriting changes.  Gait and station: Gait is normal. Tandem gait is normal.  DTR intact. DIAGNOSTIC DATA (LABS, IMAGING, TESTING) - I reviewed patient records, labs, notes, testing and imaging myself where available. No labs.   ASSESSMENT AND PLAN 47 y.o. year old female  has a past medical history of Asthma, Depression, Ectopic pregnancy (1999), Hypothyroidism, and Seasonal allergies. here with:  1. Hypersomnia reduced on Nuvigil 2. Sleep disturbance, improved on Celexa and Lunesta.   We discussed nonpharmacological treatment to promote better sleep. She is amenable to trying these things. She also would like to try a different medication if possible.  She will continue modafinil. She will follow-up in 12 months with NP.    Melvyn Novas, MD  07/06/2017, 1:38 PM [facility] [address], [address] [place], [place] [address] [phone]

## 2018-02-03 ENCOUNTER — Other Ambulatory Visit: Payer: Self-pay | Admitting: Neurology

## 2018-02-03 ENCOUNTER — Telehealth: Payer: Self-pay | Admitting: Neurology

## 2018-02-03 DIAGNOSIS — G4711 Idiopathic hypersomnia with long sleep time: Secondary | ICD-10-CM

## 2018-02-03 DIAGNOSIS — F5101 Primary insomnia: Secondary | ICD-10-CM

## 2018-02-03 DIAGNOSIS — G4719 Other hypersomnia: Secondary | ICD-10-CM

## 2018-02-03 NOTE — Telephone Encounter (Signed)
Pt states that [facility] - [place], [place] - [address] is very particular when it comes to certain medications that are filled, she states they sometimes require a cover sheet  Of sometime re: the medications.  Pt is asking if this is the case for her modafinil (PROVIGIL) 200 MG tablet & eszopiclone (LUNESTA) 2 MG TABS tablet please   Send with the cover sheet so there is no delay in her getting her refills.

## 2018-02-03 NOTE — Telephone Encounter (Signed)
Request for refills have already been pended to Dr. Cleophas Dunker

## 2018-02-06 NOTE — Telephone Encounter (Signed)
Pt has called to inform that she checked with her pharmacy on Saturday and the refills still were not at pharmacy.  Pt states that [facility] - [place], [place] - [address] is very particular when it comes to certain medications that are filled, she states they sometimes require a cover sheet  Of sometime re: the medications.  Pt is asking if this is the case for her modafinil (PROVIGIL) 200 MG tablet & eszopiclone (LUNESTA) 2 MG TABS tablet please   Send with the cover sheet so there is no delay in her getting her refills.

## 2018-02-06 NOTE — Telephone Encounter (Signed)
You Just now (4:00 PM)      RN call patient that the provigil and lunesta was sent via epic to the pharmacy. PT verbalized understanding

## 2018-02-06 NOTE — Telephone Encounter (Signed)
Request sent to Dr Vickey Huger already. Please see note thanks.

## 2018-02-06 NOTE — Telephone Encounter (Signed)
RN call patient that the provigil and lunesta was sent via epic to the pharmacy. PT verbalized understanding.

## 2018-02-08 ENCOUNTER — Telehealth: Payer: Self-pay

## 2018-02-08 NOTE — Telephone Encounter (Signed)
We received a prior authorization request for the Lunesta. I have completed and submitted the PA on Cover My Meds and should have a determination within 48-72 hours.  Cover My Meds KeyLysle House - PA Case ID: [id number] - Rx #: [id number]

## 2018-02-08 NOTE — Telephone Encounter (Signed)
We received a prior authorization request for the modafinil 200mg . I have started the PA on cover my meds, I am waiting for them to send follow up questions.   Cover My Meds Key: [facility] - Rx #: [id number]

## 2018-02-08 NOTE — Telephone Encounter (Signed)
Request Reference Number: [id number]. ESZOPICLONE TAB 2MG  is approved through 02/09/2019. For further questions, call [phone].  Pharmacy aware.

## 2018-02-08 NOTE — Telephone Encounter (Signed)
PA questions submitted to [facility]. Awaiting determination that can take up to 48-72 hours.   KeyAbbie Sons - PA Case ID: [id number] - Rx #: [id number]

## 2018-02-09 NOTE — Telephone Encounter (Signed)
Request Reference Number: [id number]. MODAFINIL TAB 200MG  is denied for not meeting the prior authorization requirement(s).  I have faxed an appeal letter to [facility], [phone]. Awaiting determination.

## 2018-02-20 NOTE — Telephone Encounter (Signed)
PA completed for modafinil to cover the patient 02/08/18-08/21/2018.  Approval # [id number]

## 2018-06-06 ENCOUNTER — Other Ambulatory Visit: Payer: Self-pay | Admitting: Neurology

## 2018-06-06 DIAGNOSIS — G4711 Idiopathic hypersomnia with long sleep time: Secondary | ICD-10-CM

## 2018-06-06 DIAGNOSIS — G4719 Other hypersomnia: Secondary | ICD-10-CM

## 2018-06-06 MED ORDER — MODAFINIL 200 MG PO TABS: 200.0000 mg | ORAL_TABLET | Freq: Every day | ORAL | 2 refills | Status: DC

## 2018-06-14 ENCOUNTER — Other Ambulatory Visit: Payer: Self-pay | Admitting: Neurology

## 2018-06-14 ENCOUNTER — Telehealth: Payer: Self-pay | Admitting: Neurology

## 2018-06-14 DIAGNOSIS — F5101 Primary insomnia: Secondary | ICD-10-CM

## 2018-06-14 NOTE — Telephone Encounter (Signed)
Pt states [facility] needs a new 90day Rx for her eszopiclone (LUNESTA) 2 MG TABS tablet sent to them. Please advise.

## 2018-06-14 NOTE — Telephone Encounter (Signed)
Medication refill entered and will send to pharmacy for the patient. Will fax today to the [facility] pharmacy on file.

## 2018-06-19 ENCOUNTER — Telehealth: Payer: Self-pay | Admitting: Neurology

## 2018-06-19 NOTE — Telephone Encounter (Signed)
Pt states that her RX for eszopiclone (LUNESTA) 2 MG TABS [facility] [address] but they have a rule that they will not fill the Rx unless it gets faxed with a cover sheet. Please advise.

## 2018-06-19 NOTE — Telephone Encounter (Signed)
I have refaxed the script for the patient with a cover sheet. Script re sent and received a confirmation.

## 2018-07-10 ENCOUNTER — Ambulatory Visit: Payer: Commercial Managed Care - PPO | Admitting: Adult Health

## 2018-07-12 ENCOUNTER — Encounter: Payer: Self-pay | Admitting: Neurology

## 2018-07-12 ENCOUNTER — Ambulatory Visit (INDEPENDENT_AMBULATORY_CARE_PROVIDER_SITE_OTHER): Payer: Commercial Managed Care - PPO | Admitting: Neurology

## 2018-07-12 DIAGNOSIS — G4719 Other hypersomnia: Secondary | ICD-10-CM | POA: Diagnosis not present

## 2018-07-12 DIAGNOSIS — F5101 Primary insomnia: Secondary | ICD-10-CM | POA: Diagnosis not present

## 2018-07-12 DIAGNOSIS — G4711 Idiopathic hypersomnia with long sleep time: Secondary | ICD-10-CM | POA: Diagnosis not present

## 2018-07-12 MED ORDER — MODAFINIL 200 MG PO TABS: 200.0000 mg | ORAL_TABLET | Freq: Every day | ORAL | 3 refills | Status: DC

## 2018-07-12 MED ORDER — ESZOPICLONE 2 MG PO TABS: ORAL_TABLET | ORAL | 0 refills | Status: DC

## 2018-07-12 MED ORDER — ESZOPICLONE 2 MG PO TABS
ORAL_TABLET | ORAL | 0 refills | Status: DC
Start: 2018-07-12 — End: 2018-10-10

## 2018-07-12 NOTE — Progress Notes (Signed)
PATIENT: Melissa House DOB: 02-25-1971  REASON FOR VISIT: follow up- sleep disturbance HISTORY FROM: patient  HISTORY OF PRESENT ILLNESS:  07-12-2018, RV yearly. Melissa House has just send her eldest off to college.  She reports no major changes in her medical social or family history.  Patient does have a history of a chronic sleep disturbance but has managed rather well, insomnia is not related to pain or any underlying psychiatric illness.  Epworth Sleepiness Scale was endorsed at 6 points fatigue severity was endorsed at 0 points, the patient is basically here today for refills.     Today is 06 July 2017 I have the pleasure of meeting with Melissa House today, a 48 year old female patient with a history of sleep disturbances.  She states that she has been able now to get more sleep stay asleep longer and feels more refreshed and restored in the mornings.  She endorsed fatigue severity at 27 points of the Epworth Sleepiness Scale at 10 points.  Her sleep is less fragmented now. She takes Celexa , synthroid, lunesta at night and arModafinil in daytime.  She gained a lot of weight over the last 2 years .    RV 04/13/2016: Melissa House is a 48 year old female with a history of sleep disturbance. She returns today for follow-up. In the past she has had a sleep study which was unremarkable. She is also been on several different sleep medications. Including Sonata, Ambien, amitriptyline, melatonin and currently Lunesta. She states that she continues to wake up several times during the night. She generally goes to bed around 10 and will usually wake up between 2 and 4 and then every hour after that. She states that she is extremely tired during the day. She uses modafinil to help her stay awake. Her Celexa was also increased to 10 mg and she states that may have been beneficial. She states that overall her sleep seems to have improved slightly over the years but she continues to wake up frequently  and have excessive daytime sleepiness. The patient does watch TV before bedtime. She sleeps with her husband. She returns today for an evaluation.   HISTORY per Dr. Vickey Huger: Melissa House is a 48 y.o. female , seen here as a referral  from Dr.Schoenhoff for a sleep consultation,   Chief complaint according to patient :" Fragmented sleep since my children were born"  Melissa House reports that she has had sleep issues for well over 15 years, and she had always wondered if her fragmented sleep was also related to her underlying thyroid condition. She reports that only with Ambien which he get 4 or 5 hours of consecutive sleep. Her primary care physician became Dr. Dimple Casey at the time and she did not want to continue Ambien indefinitely and therefore initiated a workup including a sleep study 5 years ago. This was performed at [facility]  and showed only that the patient did not have apnea or periodic limb movements but fragmented sleep. She remained on Ambien, by now for about 12 years.  She is also followed by Dr. Katrinka Blazing, an endocrinologist at [facility], and has felt that her thyroid condition is better controlled than ever. She feels overall very well -she's not depressed, she does not lack energy in daytime but she wonders why her sleep has not improved.  She reports being drowsy when driving - independent of how long the road trip is. She reports it his worse in the afternoon.   Sleep  habits are as follows: She usually goes to the bedroom between 9:30 and 10 and reads for about half an hour before she drifts to sleep. She does not struggle to go to sleep as long as she takes Ambien, she will sleep for about 4 hours but then her sleep will become fragmented and she wakes up in shorter and shorter intervals. Her bedroom is described as cool, quiet and dark and she shares the bed with her husband. Her husband has not noted any abnormal breathing, dream enactment or kicking. She does not  report vivid or bizarre dreaming. She sleeps mostly on her side but often wakes up on her back. She sleeps on one pillow only. She is not woken by the urge to urinate, but when she wakes up she was sometimes will have a bathroom break. She does not wake up with headaches nor from headaches. She remains unrefreshed and unrestored with the feeling of not getting enough sleep. She usually rises at 6 AM, she relies on an alarm but she is usually awake before. She feels that she gets enough hours of sleep but that the quality of sleep is lacking. Only when she has nasal congestion or a cold which she breathes through her mouth and snores. In her review of systems she endorsed sleepiness, memory loss, the feeling of not enough sleep.   Sleep medical history and family sleep history:  No apnea history, no personal history of sleep walking or night terrors. No parental history of apnea.  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 She does not drink alcohol, she does not use tobacco products. Patient reports being very sensitive to caffeine products as well as chocolate. 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    01-07-2016, I am meeting today with Melissa House after her recent polysomnography from 08/26/2015. I would like to start with a quotation from her Epworth score which she endorsed at 19 points and last visit at 16. Her sleep study revealed an AHI of 3.3 and only during REM sleep was clinically significant AHI of 16.2 in supine sleep 11.6. My recommendations were to avoid supine sleep and also to treat snoring. She may benefit from a dental device weight loss or a possible ENT surgery. However this very mild sleep apnea does not at all explained her excessive daytime sleepiness. She reports no vivid dreaming, no sleep paralysis. He may sometimes wake up sore but she does not feel that her sleep is fragmented by discomfort or  pain. Her sleep was fragmented in spite of taking Ambien but I could not find a physiological reason for it. He does sometimes naps but does not dream but nothing to her recollection. I suspect a level of anxiety- and still recommend avoiding supine sleep. Tennisball methode. She has tried melatonin without success.   Mrs. Stratton is here today reporting on 02/26/2016 her experience with Lunesta. She could not tolerate Sonata well, Lunesta helped her to initiate sleep and she can sleep for for perhaps 5 hours. A higher dose of 2 mg seem to help maintain sleep better and also helped her to reinitiate sleep if she awoke once at night. It also caused a slight hangover effect in the morning. She is concerned about not having deep and restorative sleep rather than having insomnia per se. Fatigue severity score 37, Epworth sleepiness score is 13. Her fear is that she is too sleepy to drive or could be  too sleepy to drive safely the daytime stimulants have helped her to control sleepiness to some degree but she still concerned about the afternoon hours when the effect wears off. Long discussion, start celexa at 10  Mg up from 5 mg.  Discussed amitryptilin as a sleep PRN.  Dry eyes and dry Mouth. Can take prn alternating with lunesta. Try lunesta 3 days a week , not dialy. Hypersomnia in daytime .    REVIEW OF SYSTEMS: Out of a complete 14 system review of symptoms, the patient complains only of the following symptoms, and all other reviewed systems are negative.  Fatigue, frequent infections, frequent waking, daytime sleepiness  ALLERGIES: No Known Allergies  HOME MEDICATIONS: Outpatient Medications Prior to Visit  Medication Sig Dispense Refill  . citalopram (CELEXA) 10 MG tablet Take 10 mg by mouth daily.    . eszopiclone (LUNESTA) 2 MG TABS tablet TAKE ONE TABLET BY MOUTH AT BEDTIME AS NEEDED FOR SLEEP  (Patient taking differently: TAKE ONE TABLET BY MOUTH AT BEDTIME AS NEEDED FOR SLEEP (e  prescibe cause [facility] requires cover sheet when faxed)) 90 tablet 0  . levothyroxine (SYNTHROID, LEVOTHROID) 125 MCG tablet Take 125 mcg by mouth daily.    . modafinil (PROVIGIL) 200 MG tablet Take 1 tablet (200 mg total) by mouth daily. (Patient taking differently: Take 200 mg by mouth daily. E prescribe for patient since [facility] requires face cover when faxed) 90 tablet 2  . norethindrone-ethinyl estradiol-iron (MICROGESTIN FE,GILDESS FE,LOESTRIN FE) 1.5-30 MG-MCG tablet Take 1 tablet by mouth daily.     No facility-administered medications prior to visit.     PAST MEDICAL HISTORY: Past Medical History:  Diagnosis Date  . Asthma   . Depression   . Ectopic pregnancy 1999  . Hypothyroidism   . Seasonal allergies     PAST SURGICAL HISTORY: Past Surgical History:  Procedure Laterality Date  . SHOULDER SURGERY  1992   Left    FAMILY HISTORY: Family History  Problem Relation Age of Onset  . Hypertension Mother   . Hypercholesterolemia Mother   . Hypertension Father   . Lymphoma Father        NHL  . Colon cancer Neg Hx     SOCIAL HISTORY: Social History   Socioeconomic History  . Marital status: Married    Spouse name: Not on file  . Number of children: 2  . Years of education: 39  . Highest education level: Not on file  Occupational History  . 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 Financial resource strain: Not on file  . Food insecurity:    Worry: Not on file    Inability: Not on file  . Transportation needs:    Medical: Not on file    Non-medical: Not on file  Tobacco Use  . Smoking status: Never Smoker  . Smokeless tobacco: Never Used  Substance and Sexual Activity  . Alcohol use: Yes    Comment: occasional  . Drug use: No  . Sexual activity: Not on file  Lifestyle  . Physical activity:    Days per week: Not on file    Minutes per session: Not on file  . Stress: Not on file  Relationships  . Social  connections:    Talks on phone: Not on file    Gets together: Not on file    Attends religious service: Not on file    Active member of club or organization: Not on  file    Attends meetings of clubs or organizations: Not on file    Relationship status: Not on file  . Intimate partner violence:    Fear of current or ex partner: Not on file    Emotionally abused: Not on file    Physically abused: Not on file    Forced sexual activity: Not on file  Other Topics Concern  . Not on file  Social History Narrative   Lives at home with husband, children    Caffeine use- 1-2 a week      PHYSICAL EXAM  Vitals:   07/12/18 1544  BP: (!) 143/86  Pulse: 71  Weight: 194 lb (88 kg)  Height: 5\' 5"  (1.651 m)   Body mass index is 32.28 kg/m.  Generalized: Well developed, in no acute distress   Neck circumference: 15 "   Mallampati : Grade 2   Neurological examination  Mentation: Alert oriented to time, place, history taking. Cranial nerve :  Taste and smell are intact. Pupils were equal , are round and  reactive to light- visual field were full on confrontational test.  Facial sensation and strength were normal.  Uvula and tongue midline. Hearing is preserved. Head turning and shoulder shrug  were normal and symmetric. Motor: 5 / 5 strength , symmetric motor tone is noted throughout, no grip strength loss Sensory: Sensory testing is intact to soft touch on all 4 extremities.  Coordination:   No handwriting changes.  Gait and station: Gait is normal.   DTR intact. DIAGNOSTIC DATA (LABS, IMAGING, TESTING) - I reviewed patient records, labs, notes, testing and imaging myself where available. No labs.   ASSESSMENT AND PLAN 48 y.o. year old female  has a past medical history of Asthma, Depression, Ectopic pregnancy (1999), Hypothyroidism, and Seasonal allergies. here with:  1. Hypersomnia reduced on Nuvigil-  ideopathic  2. Sleep disturbance, improved on Celexa and Lunesta.   We  discussed nonpharmacological treatment to promote better sleep. She is amenable to trying these things.  he also would like to try a different medication if possible.   She will continue on modafinil.  She will follow-up in 12 months with NP.    Melvyn Novas, MD  07/12/2018, 4:11 PM [facility] [address] [place], [place] [address] [phone]

## 2018-10-10 ENCOUNTER — Other Ambulatory Visit: Payer: Self-pay | Admitting: Neurology

## 2018-10-10 ENCOUNTER — Telehealth: Payer: Self-pay | Admitting: Neurology

## 2018-10-10 DIAGNOSIS — F5101 Primary insomnia: Secondary | ICD-10-CM

## 2018-10-10 MED ORDER — ESZOPICLONE 2 MG PO TABS: ORAL_TABLET | ORAL | 2 refills | Status: DC

## 2018-10-10 NOTE — Telephone Encounter (Signed)
I have routed this request to Dr Dohmeier for review. The pt is due for the medication and [place] registry was verified.  

## 2018-10-10 NOTE — Telephone Encounter (Signed)
Pt is calling for a refill of eszopiclone (LUNESTA) 2 MG TABS tablet , pt states she needs a year supply sent to [id number] - [place], [place] - [address]

## 2019-01-22 ENCOUNTER — Telehealth: Payer: Self-pay | Admitting: Neurology

## 2019-01-22 ENCOUNTER — Other Ambulatory Visit: Payer: Self-pay | Admitting: Neurology

## 2019-01-22 DIAGNOSIS — G4719 Other hypersomnia: Secondary | ICD-10-CM

## 2019-01-22 DIAGNOSIS — G4711 Idiopathic hypersomnia with long sleep time: Secondary | ICD-10-CM

## 2019-01-22 DIAGNOSIS — F5101 Primary insomnia: Secondary | ICD-10-CM

## 2019-01-22 MED ORDER — ESZOPICLONE 2 MG PO TABS: ORAL_TABLET | ORAL | 5 refills | Status: DC

## 2019-01-22 NOTE — Telephone Encounter (Signed)
Modafinil and Lunesta [place] Database Verified LR: 11-01-2018 Qty: 90 Pending appointment: No pending appt

## 2019-01-22 NOTE — Telephone Encounter (Signed)
I have routed this request to Dr Dohmeier for review. The pt is due for the medication and [place] registry was verified.  

## 2019-01-22 NOTE — Telephone Encounter (Signed)
Pt is needing a refill on her eszopiclone (LUNESTA) 2 MG TABS tablet and her modafinil (PROVIGIL) 200 MG tablet sent to [facility] on [address] and she emphasized that the request Needs to be sent with a cover sheet or they will not fill it. Pt is needing 6 months 90day supply

## 2019-01-22 NOTE — Telephone Encounter (Signed)
NP revisit to be scheduled.

## 2019-04-23 ENCOUNTER — Other Ambulatory Visit: Payer: Self-pay | Admitting: Neurology

## 2019-04-23 ENCOUNTER — Telehealth: Payer: Self-pay | Admitting: Neurology

## 2019-04-23 DIAGNOSIS — F5101 Primary insomnia: Secondary | ICD-10-CM

## 2019-04-23 DIAGNOSIS — G4719 Other hypersomnia: Secondary | ICD-10-CM

## 2019-04-23 DIAGNOSIS — G4711 Idiopathic hypersomnia with long sleep time: Secondary | ICD-10-CM

## 2019-04-23 MED ORDER — ESZOPICLONE 2 MG PO TABS: ORAL_TABLET | ORAL | 1 refills | Status: DC

## 2019-04-23 MED ORDER — MODAFINIL 200 MG PO TABS: 200.0000 mg | ORAL_TABLET | Freq: Every day | ORAL | 1 refills | Status: DC

## 2019-04-23 NOTE — Telephone Encounter (Signed)
Corrected and resent

## 2019-04-23 NOTE — Telephone Encounter (Signed)
Pt called again and stated that the medication was sent to the wrong Pharmacy. She says [facility] called her about the medications but it is to be sent to [facility] with a cover sheet.

## 2019-04-23 NOTE — Telephone Encounter (Signed)
1) Medication(s) Requested (by name): eszopiclone (LUNESTA) 2 MG TABS tablet  modafinil (PROVIGIL) 200 MG tablet   2) Pharmacy of Choice: [facility] # [place], [place]   3) special Requests:  Patient called stating the pharmacy is needing both presciptions sent over with a cover sheet with [facility] information "official" cover sheet. Please follow up.

## 2019-04-23 NOTE — Telephone Encounter (Signed)
I have routed this request to Dr Dohmeier for review. The pt is due for the medication and [place] registry was verified.  

## 2019-04-23 NOTE — Telephone Encounter (Signed)
PA completed through cover my meds.  OEH:[id number]  Will take up to 72 hrs.

## 2019-04-23 NOTE — Addendum Note (Signed)
Addended by: Darleen Crocker on: 04/23/2019 12:01 PM   Modules accepted: Orders

## 2019-04-24 NOTE — Telephone Encounter (Signed)
PA was denied for modafinil, I will attempt and appeal letter to see if we can try and get it covered.

## 2019-04-25 NOTE — Telephone Encounter (Signed)
After reviewing the appeal [facility] has approved the medication for the patient.  Approval #- [id number] until 04/23/2020

## 2019-04-26 ENCOUNTER — Encounter: Payer: Self-pay | Admitting: Gastroenterology

## 2019-04-27 ENCOUNTER — Encounter: Payer: Self-pay | Admitting: Gastroenterology

## 2019-07-07 ENCOUNTER — Ambulatory Visit: Payer: Self-pay | Attending: Internal Medicine

## 2019-07-30 ENCOUNTER — Encounter: Payer: Self-pay | Admitting: Gastroenterology

## 2019-09-21 ENCOUNTER — Other Ambulatory Visit: Payer: Self-pay

## 2019-09-21 ENCOUNTER — Ambulatory Visit (AMBULATORY_SURGERY_CENTER): Payer: Self-pay | Admitting: *Deleted

## 2019-09-21 VITALS — Temp 97.1°F | Ht 65.0 in | Wt 201.0 lb

## 2019-09-21 DIAGNOSIS — Z1211 Encounter for screening for malignant neoplasm of colon: Secondary | ICD-10-CM

## 2019-09-21 MED ORDER — SUTAB 1479-225-188 MG PO TABS: 1.0000 | ORAL_TABLET | ORAL | 0 refills | Status: DC

## 2019-09-21 NOTE — Progress Notes (Signed)
Patient is here in-person for PV. Patient denies any allergies to eggs or soy. Patient denies any problems with anesthesia/sedation. Patient denies any oxygen use at home. Patient denies taking any diet/weight loss medications or blood thinners. Patient is not being treated for MRSA or C-diff. Patient is aware of our care-partner policy and Covid-19 safety protocol. EMMI education assisgned to the patient for the procedure, this was explained and instructions given to patient.   Completed covid vaccines on 07/26/19. Prep Prescription coupon given to the patient.

## 2019-10-05 ENCOUNTER — Ambulatory Visit (AMBULATORY_SURGERY_CENTER): Payer: Commercial Managed Care - PPO | Admitting: Gastroenterology

## 2019-10-05 ENCOUNTER — Encounter: Payer: Self-pay | Admitting: Gastroenterology

## 2019-10-05 ENCOUNTER — Other Ambulatory Visit: Payer: Self-pay

## 2019-10-05 VITALS — BP 153/85 | HR 59 | Temp 97.5°F | Resp 15 | Ht 65.0 in | Wt 201.0 lb

## 2019-10-05 DIAGNOSIS — Z1211 Encounter for screening for malignant neoplasm of colon: Secondary | ICD-10-CM

## 2019-10-05 MED ORDER — SODIUM CHLORIDE 0.9 % IV SOLN: 500.0000 mL | INTRAVENOUS | Status: DC

## 2019-10-05 NOTE — Patient Instructions (Signed)
Handouts on high fiber diet, diverticulosis and hemorrhoids given to you today    YOU HAD AN ENDOSCOPIC PROCEDURE TODAY AT THE [facility]:   Refer to the procedure report that was given to you for any specific questions about what was found during the examination.  If the procedure report does not answer your questions, please call your gastroenterologist to clarify.  If you requested that your care partner not be given the details of your procedure findings, then the procedure report has been included in a sealed envelope for you to review at your convenience later.  YOU SHOULD EXPECT: Some feelings of bloating in the abdomen. Passage of more gas than usual.  Walking can help get rid of the air that was put into your GI tract during the procedure and reduce the bloating. If you had a lower endoscopy (such as a colonoscopy or flexible sigmoidoscopy) you may notice spotting of blood in your stool or on the toilet paper. If you underwent a bowel prep for your procedure, you may not have a normal bowel movement for a few days.  Please Note:  You might notice some irritation and congestion in your nose or some drainage.  This is from the oxygen used during your procedure.  There is no need for concern and it should clear up in a day or so.  SYMPTOMS TO REPORT IMMEDIATELY:   Following lower endoscopy (colonoscopy or flexible sigmoidoscopy):  Excessive amounts of blood in the stool  Significant tenderness or worsening of abdominal pains  Swelling of the abdomen that is new, acute  Fever of 100F or higher  For urgent or emergent issues, a gastroenterologist can be reached at any hour by calling [phone]. Do not use MyChart messaging for urgent concerns.    DIET:  We do recommend a small meal at first, but then you may proceed to your regular diet.  Drink plenty of fluids but you should avoid alcoholic beverages for 24 hours.  ACTIVITY:  You should plan to take it easy for the  rest of today and you should NOT DRIVE or use heavy machinery until tomorrow (because of the sedation medicines used during the test).    FOLLOW UP: Our staff will call the number listed on your records 48-72 hours following your procedure to check on you and address any questions or concerns that you may have regarding the information given to you following your procedure. If we do not reach you, we will leave a message.  We will attempt to reach you two times.  During this call, we will ask if you have developed any symptoms of COVID 19. If you develop any symptoms (ie: fever, flu-like symptoms, shortness of breath, cough etc.) before then, please call [phone].  If you test positive for Covid 19 in the 2 weeks post procedure, please call and report this information to [place].    If any biopsies were taken you will be contacted by phone or by letter within the next 1-3 weeks.  Please call us at [phone] if you have not heard about the biopsies in 3 weeks.    SIGNATURES/CONFIDENTIALITY: You and/or your care partner have signed paperwork which will be entered into your electronic medical record.  These signatures attest to the fact that that the information above on your After Visit Summary has been reviewed and is understood.  Full responsibility of the confidentiality of this discharge information lies with you and/or your care-partner.

## 2019-10-05 NOTE — Op Note (Signed)
[facility] Patient Name: Melissa House Procedure Date: 10/05/2019 8:54 AM MRN: [id number] Endoscopist: Meryl Dare , MD Age: 49 Referring MD:  Date of Birth: [birth year] Gender: Female Account #: [id number] Procedure:                Colonoscopy Indications:              Screening for colorectal malignant neoplasm Medicines:                Monitored Anesthesia Care Procedure:                Pre-Anesthesia Assessment:                           - Prior to the procedure, a History and Physical                            was performed, and patient medications and                            allergies were reviewed. The patient's tolerance of                            previous anesthesia was also reviewed. The risks                            and benefits of the procedure and the sedation                            options and risks were discussed with the patient.                            All questions were answered, and informed consent                            was obtained. Prior Anticoagulants: The patient has                            taken no previous anticoagulant or antiplatelet                            agents. ASA Grade Assessment: II - A patient with                            mild systemic disease. After reviewing the risks                            and benefits, the patient was deemed in                            satisfactory condition to undergo the procedure.                           After obtaining informed consent, the colonoscope  was passed under direct vision. Throughout the                            procedure, the patient's blood pressure, pulse, and                            oxygen saturations were monitored continuously. The                            Colonoscope was introduced through the anus and                            advanced to the the cecum, identified by                            appendiceal orifice and  ileocecal valve. The                            ileocecal valve, appendiceal orifice, and rectum                            were photographed. The quality of the bowel                            preparation was good. The colonoscopy was performed                            without difficulty. The patient tolerated the                            procedure well. Scope In: 9:05:36 AM Scope Out: 9:17:42 AM Scope Withdrawal Time: 0 hours 10 minutes 9 seconds  Total Procedure Duration: 0 hours 12 minutes 6 seconds  Findings:                 The perianal and digital rectal examinations were                            normal.                           A few small-mouthed diverticula were found in the                            sigmoid colon.                           Internal hemorrhoids were found during                            retroflexion. The hemorrhoids were small and Grade                            I (internal hemorrhoids that do not prolapse).  The exam was otherwise without abnormality on                            direct and retroflexion views. Complications:            No immediate complications. Estimated blood loss:                            None. Estimated Blood Loss:     Estimated blood loss: none. Impression:               - Mild diverticulosis in the sigmoid colon.                           - Internal hemorrhoids.                           - The examination was otherwise normal on direct                            and retroflexion views.                           - No specimens collected. Recommendation:           - Repeat colonoscopy in 10 years for screening                            purposes.                           - Patient has a contact number available for                            emergencies. The signs and symptoms of potential                            delayed complications were discussed with the                            patient.  Return to normal activities tomorrow.                            Written discharge instructions were provided to the                            patient.                           - High fiber diet.                           - Continue present medications. Meryl Dare, MD 10/05/2019 9:20:54 AM This report has been signed electronically.

## 2019-10-05 NOTE — Progress Notes (Signed)
pt tolerated well. VSS. awake and to recovery. Report given to RN.  

## 2019-10-05 NOTE — Progress Notes (Signed)
VS- Melissa House  covid vaccines completed 07-16-19  Pt's states no medical or surgical changes since previsit or office visit.

## 2019-10-09 ENCOUNTER — Other Ambulatory Visit: Payer: Self-pay | Admitting: Neurology

## 2019-10-09 ENCOUNTER — Telehealth: Payer: Self-pay | Admitting: *Deleted

## 2019-10-09 ENCOUNTER — Telehealth: Payer: Self-pay | Admitting: Neurology

## 2019-10-09 DIAGNOSIS — G4719 Other hypersomnia: Secondary | ICD-10-CM

## 2019-10-09 DIAGNOSIS — F5101 Primary insomnia: Secondary | ICD-10-CM

## 2019-10-09 DIAGNOSIS — G4711 Idiopathic hypersomnia with long sleep time: Secondary | ICD-10-CM

## 2019-10-09 NOTE — Telephone Encounter (Signed)
1. Have you developed a fever since your procedure? no  2.   Have you had an respiratory symptoms (SOB or cough) since your procedure? no  3.   Have you tested positive for COVID 19 since your procedure no  4.   Have you had any family members/close contacts diagnosed with the COVID 19 since your procedure?  no   If yes to any of these questions please route to Laverna Peace, RN and Charlett Lango, RN Follow up Call-  Call back number 10/05/2019  Post procedure Call Back phone  # [phone]  Permission to leave phone message Yes  Some recent data might be hidden     Patient questions:  Do you have a fever, pain , or abdominal swelling? No. Pain Score  0 *  Have you tolerated food without any problems? Yes.    Have you been able to return to your normal activities? Yes.    Do you have any questions about your discharge instructions: Diet   No. Medications  No. Follow up visit  No.  Do you have questions or concerns about your Care? No.  Actions: * If pain score is 4 or above: No action needed, pain <4.

## 2019-10-09 NOTE — Telephone Encounter (Signed)
Spoke with Dr. Vickey Huger. Ok to refill early,3 month supply with 1 refill. Per [facility] registry, pt last filled these medications on 07/19/19 as a 90 day supply. Modafinil 200 Mg Tablet #90.00 & Eszopiclone 2 Mg Tablet #90. Refill requests sent to Dr. Vickey Huger to e-scribe.

## 2019-10-09 NOTE — Telephone Encounter (Signed)
Pt has called, she scheduled her annual f/u.  Pt is asking if a cover page can be faxed for [facility] [phone] (Fax) along with a note stating pt needs her medication early because she is going on vacation Thursday of this week.

## 2019-10-10 ENCOUNTER — Ambulatory Visit (INDEPENDENT_AMBULATORY_CARE_PROVIDER_SITE_OTHER): Payer: Commercial Managed Care - PPO | Admitting: Family Medicine

## 2019-10-10 ENCOUNTER — Encounter: Payer: Self-pay | Admitting: Family Medicine

## 2019-10-10 ENCOUNTER — Other Ambulatory Visit: Payer: Self-pay

## 2019-10-10 VITALS — BP 132/83 | HR 71 | Ht 65.0 in | Wt 200.0 lb

## 2019-10-10 DIAGNOSIS — F5101 Primary insomnia: Secondary | ICD-10-CM | POA: Diagnosis not present

## 2019-10-10 DIAGNOSIS — G4719 Other hypersomnia: Secondary | ICD-10-CM | POA: Diagnosis not present

## 2019-10-10 DIAGNOSIS — G4711 Idiopathic hypersomnia with long sleep time: Secondary | ICD-10-CM

## 2019-10-10 MED ORDER — MODAFINIL 200 MG PO TABS: 200.0000 mg | ORAL_TABLET | Freq: Every day | ORAL | 1 refills | Status: DC

## 2019-10-10 MED ORDER — ESZOPICLONE 2 MG PO TABS: ORAL_TABLET | ORAL | 1 refills | Status: DC

## 2019-10-10 NOTE — Progress Notes (Signed)
PATIENT: Melissa House DOB: 11/21/1970  REASON FOR VISIT: follow up HISTORY FROM: patient  Chief Complaint  Patient presents with  . Follow-up    insomnia, rm 13, alone, pt states she is doing well, no concerns      HISTORY OF PRESENT ILLNESS: Today 10/10/19 [name] is a 49 y.o. female here today for follow up. She continues Lunesta and modafinil. She usually takes Lunesta around 9:30pm and wakes around 6am. She takes modafinil every morning. She feels that the combination works really well for her. She does endorse dry mouth but otherwise, tolerates medications well.   HISTORY: (copied from Dr Dohmeier's note on 07/12/2018)  07-12-2018, RV yearly. Melissa House has just send her eldest off to college.  She reports no major changes in her medical social or family history.  Patient does have a history of a chronic sleep disturbance but has managed rather well, insomnia is not related to pain or any underlying psychiatric illness.  Epworth Sleepiness Scale was endorsed at 6 points fatigue severity was endorsed at 0 points, the patient is basically here today for refills.  Today is 06 July 2017 I have the pleasure of meeting with Melissa House today, a 49 year old female patient with a history of sleep disturbances.  She states that she has been able now to get more sleep stay asleep longer and feels more refreshed and restored in the mornings.  She endorsed fatigue severity at 27 points of the Epworth Sleepiness Scale at 10 points.  Her sleep is less fragmented now. She takes Celexa , synthroid, lunesta at night and arModafinil in daytime.  She gained a lot of weight over the last 2 years .    RV 04/13/2016: Melissa House is a 49 year old female with a history of sleep disturbance. She returns today for follow-up. In the past she has had a sleep study which was unremarkable. She is also been on several different sleep medications. Including Sonata, Ambien, amitriptyline, melatonin and  currently Lunesta. She states that she continues to wake up several times during the night. She generally goes to bed around 10 and will usually wake up between 2 and 4 and then every hour after that. She states that she is extremely tired during the day. She uses modafinil to help her stay awake. Her Celexa was also increased to 10 mg and she states that may have been beneficial. She states that overall her sleep seems to have improved slightly over the years but she continues to wake up frequently and have excessive daytime sleepiness. The patient does watch TV before bedtime. She sleeps with her husband. She returns today for an evaluation.  HISTORY per Dr. Vickey Huger: Melissa Paganini Tuggleis a 49 y.o.female, seen here as a referral from Dr.Schoenhoff for a sleep consultation,   Chief complaint according to patient :" Fragmented sleep since my children were born"  Melissa House reports that she has had sleep issues for well over 15 years, and she had always wondered if her fragmented sleep was also related to her underlying thyroid condition. She reports that only with Ambien which he get 4 or 5 hours of consecutive sleep. Her primary care physician became Dr. Dimple Casey at the time and she did not want to continue Ambien indefinitely and therefore initiated a workup including a sleep study 5 years ago. This was performed at [facility] and showed only that the patient did not have apnea or periodic limb movements but fragmented sleep. She remained on  Ambien, by now for about 12 years.  She is also followed by Dr. Katrinka Blazing, an endocrinologist at [facility], and has felt that her thyroid condition is better controlled than ever. She feels overall very well -she's not depressed, she does not lack energy in daytime but she wonders why her sleep has not improved.  She reports being drowsy when driving - independent of how long the road trip is. She reports it his worse in the afternoon.  Sleep habits are  as follows: She usually goes to the bedroom between 9:30 and 10 and reads for about half an hour before she drifts to sleep. She does not struggle to go to sleep as long as she takes Ambien, she will sleep for about 4 hours but then her sleep will become fragmented and she wakes up in shorter and shorter intervals. Her bedroom is described as cool, quiet and dark and she shares the bed with her husband. Her husband has not noted any abnormal breathing, dream enactment or kicking. She does not report vivid or bizarre dreaming. She sleeps mostly on her side but often wakes up on her back. She sleeps on one pillow only. She is not woken by the urge to urinate, but when she wakes up she was sometimes will have a bathroom break. She does not wake up with headaches nor from headaches. She remains unrefreshed and unrestored with the feeling of not getting enough sleep. She usually rises at 6 AM, she relies on an alarm but she is usually awake before. She feels that she gets enough hours of sleep but that the quality of sleep is lacking. Only when she has nasal congestion or a cold which she breathes through her mouth and snores. In her review of systems she endorsed sleepiness, memory loss, the feeling of not enough sleep.  Sleep medical history and family sleep history: No apnea history, no personal history of sleep walking or night terrors. No parental history of apnea.  Social history: Married with children, working as a Runner, broadcasting/film/video, avoids caffeine, because of headaches. She does not drink alcohol, she does not use tobacco products. Patient reports being very sensitive to caffeine products as well as chocolate. 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    01-07-2016, I am meeting today with Melissa House after her recent polysomnography from 08/26/2015. I would like to start with a quotation from her Epworth score which she endorsed at 19 points and last visit at 16. Her sleep study revealed an  AHI of 3.3 and only during REM sleep was clinically significant AHI of 16.2 in supine sleep 11.6. My recommendations were to avoid supine sleep and also to treat snoring. She may benefit from a dental device weight loss or a possible ENT surgery. However this very mild sleep apnea does not at all explained her excessive daytime sleepiness. She reports no vivid dreaming, no sleep paralysis. He may sometimes wake up sore but she does not feel that her sleep is fragmented by discomfort or pain. Her sleep was fragmented in spite of taking Ambien but I could not find a physiological reason for it. He does sometimes naps but does not dream but nothing to her recollection. I suspect a level of anxiety- and still recommend avoiding supine sleep. Tennisball methode. She has tried melatonin without success.   Mrs. Cazarez is here today reporting on 02/26/2016 her experience with Lunesta. She could not tolerate Sonata well, Lunesta helped her to initiate sleep and she can  sleep for for perhaps 5 hours. A higher dose of 2 mg seem to help maintain sleep better and also helped her to reinitiate sleep if she awoke once at night. It also caused a slight hangover effect in the morning. She is concerned about not having deep and restorative sleep rather than having insomnia per se. Fatigue severity score 37, Epworth sleepiness score is 13. Her fear is that she is too sleepy to drive or could be too sleepy to drive safely the daytime stimulants have helped her to control sleepiness to some degree but she still concerned about the afternoon hours when the effect wears off. Long discussion, start celexa at 10 Mg up from 5 mg.  Discussed amitryptilin as a sleep PRN.  Dry eyes and dry Mouth. Can take prn alternating with lunesta. Try lunesta 3 days a week , not dialy. Hypersomnia in daytime .   REVIEW OF SYSTEMS: Out of a complete 14 system review of symptoms, the patient complains only of the following symptoms, insomnia,  excessive daytime sleepiness and all other reviewed systems are negative.  ALLERGIES: No Known Allergies  HOME MEDICATIONS: Outpatient Medications Prior to Visit  Medication Sig Dispense Refill  . citalopram (CELEXA) 10 MG tablet Take 10 mg by mouth daily.    . eszopiclone (LUNESTA) 2 MG TABS tablet TAKE ONE TABLET BY MOUTH AT BEDTIME AS NEEDED FOR SLEEP 90 tablet 0  . levothyroxine (SYNTHROID, LEVOTHROID) 125 MCG tablet Take 125 mcg by mouth daily.    . modafinil (PROVIGIL) 200 MG tablet TAKE ONE TABLET BY MOUTH ONE TIME DAILY  90 tablet 0  . norethindrone-ethinyl estradiol-iron (MICROGESTIN FE,GILDESS FE,LOESTRIN FE) 1.5-30 MG-MCG tablet Take 1 tablet by mouth daily.     No facility-administered medications prior to visit.    PAST MEDICAL HISTORY: Past Medical History:  Diagnosis Date  . Asthma   . Depression   . Ectopic pregnancy 1999  . Hypothyroidism   . Seasonal allergies     PAST SURGICAL HISTORY: Past Surgical History:  Procedure Laterality Date  . ECTOPIC PREGNANCY SURGERY    . SHOULDER SURGERY  1992   Left  . UPPER GASTROINTESTINAL ENDOSCOPY  06/14/2011    FAMILY HISTORY: Family History  Problem Relation Age of Onset  . Hypertension Mother   . Hypercholesterolemia Mother   . Hypertension Father   . Lymphoma Father        NHL  . Colon cancer Neg Hx   . Esophageal cancer Neg Hx   . Rectal cancer Neg Hx   . Stomach cancer Neg Hx   . Colon polyps Neg Hx     SOCIAL HISTORY: Social History   Socioeconomic History  . Marital status: Married    Spouse name: Not on file  . Number of children: 2  . Years of education: 88  . Highest education level: Not on file  Occupational History  . Occupation: Product manager: [employer]  Tobacco Use  . Smoking status: Never Smoker  . Smokeless tobacco: Never Used  Substance and Sexual Activity  . Alcohol use: Yes    Comment: occasional  . Drug use: No  . Sexual activity: Not on file    Other Topics Concern  . Not on file  Social History Narrative   Lives at home with husband, children    Caffeine use- 1-2 a week   Social Determinants of Health   Financial Resource Strain:   . Difficulty of Paying Living Expenses:  Food Insecurity:   . Worried About Programme researcher, broadcasting/film/video in the Last Year:   . Barista in the Last Year:   Transportation Needs:   . Freight forwarder (Medical):   Marland Kitchen Lack of Transportation (Non-Medical):   Physical Activity:   . Days of Exercise per Week:   . Minutes of Exercise per Session:   Stress:   . Feeling of Stress :   Social Connections:   . Frequency of Communication with Friends and Family:   . Frequency of Social Gatherings with Friends and Family:   . Attends Religious Services:   . Active Member of Clubs or Organizations:   . Attends Banker Meetings:   Marland Kitchen Marital Status:   Intimate Partner Violence:   . Fear of Current or Ex-Partner:   . Emotionally Abused:   Marland Kitchen Physically Abused:   . Sexually Abused:       PHYSICAL EXAM  Vitals:   10/10/19 1514  BP: 132/83  Pulse: 71  Weight: 200 lb (90.7 kg)  Height: 5\' 5"  (1.651 m)   Body mass index is 33.28 kg/m.  Generalized: Well developed, in no acute distress  Cardiology: normal rate and rhythm, no murmur noted Respiratory: clear to auscultation bilaterally  Neurological examination  Mentation: Alert oriented to time, place, history taking. Follows all commands speech and language fluent Cranial nerve II-XII: Pupils were equal round reactive to light. Extraocular movements were full, visual field were full on confrontational test. Facial sensation and strength were normal.  Motor: The motor testing reveals 5 over 5 strength of all 4 extremities. Good symmetric motor tone is noted throughout.  Sensory: Sensory testing is intact to soft touch on all 4 extremities. No evidence of extinction is noted.  Coordination: Cerebellar testing reveals good  finger-nose-finger and heel-to-shin bilaterally.  Gait and station: Gait is normal.  Reflexes: Deep tendon reflexes are symmetric and normal bilaterally.   DIAGNOSTIC DATA (LABS, IMAGING, TESTING) - I reviewed patient records, labs, notes, testing and imaging myself where available.  No flowsheet data found.   No results found for: WBC, HGB, HCT, MCV, PLT No results found for: NA, K, CL, CO2, GLUCOSE, BUN, CREATININE, CALCIUM, PROT, ALBUMIN, AST, ALT, ALKPHOS, BILITOT, GFRNONAA, GFRAA No results found for: CHOL, HDL, LDLCALC, LDLDIRECT, TRIG, CHOLHDL No results found for: No results found for: VITAMINB12 No results found for: TSH     ASSESSMENT AND PLAN 49 y.o. year old female  has a past medical history of Asthma, Depression, Ectopic pregnancy (1999), Hypothyroidism, and Seasonal allergies. here with     ICD-10-CM   1. Hypersomnia, idiopathic  G47.11   2. Primary insomnia  F51.01     Shasta continues to do well on Lunesta 2 mg every night and modafinil 200 mg daily.  She feels that insomnia and daytime sleepiness are well managed on this combination of medications.  Regularly by primary care.  Sleep hygiene reviewed.  Healthy lifestyle habits encouraged.  I have refilled medications for 6 months.  Okay to refill early as patient has upcoming vacation.  PMP aware reviewed and appropriate refills received in the past.  Pharmacy called to confirm receipt of electronic prescription.  She will follow-up with [facility] in 1 year, sooner if needed.  She verbalizes understanding and agreement with this plan.   No orders of the defined types were placed in this encounter.    No orders of the defined types were placed in this encounter.  I spent 15 minutes with the patient. 50% of this time was spent counseling and educating patient on plan of care and medications.    Shawnie Dappermy Caitrin Pendergraph, FNP-C 10/10/2019, 3:32 PM [facility] [address], [address] [place], [place]  [id number] [phone]

## 2019-10-10 NOTE — Patient Instructions (Signed)
Continue Lunesta and modafinil as prescribed.   Sleep hygiene advised. Stay well hydrated. Healthy lifestyle habits with well balanced diet and regular exercise encouraged.   Follow up in 1 year   Hypersomnia Hypersomnia is a condition in which a person feels very tired during the day even though he or she gets plenty of sleep at night. A person with this condition may take naps during the day and may find it very difficult to wake up from sleep. Hypersomnia may affect a person's ability to think, concentrate, drive, or remember things. What are the causes? The cause of this condition may not be known. Possible causes include:  Certain medicines.  Sleep disorders, such as narcolepsy and sleep apnea.  Injury to the head, brain, or spinal cord.  Drug or alcohol use.  Gastroesophageal reflux disease (GERD).  Tumors.  Certain medical conditions, such as depression, diabetes, or an underactive thyroid gland (hypothyroidism). What are the signs or symptoms? The main symptoms of hypersomnia include:  Feeling very tired throughout the day, regardless of how much sleep you got the night before.  Having trouble waking up. Others may find it difficult to wake you up when you are sleeping.  Sleeping for longer and longer periods at a time.  Taking naps throughout the day. Other symptoms may include:  Feeling restless, anxious, or annoyed.  Lacking energy.  Having trouble with: ? Remembering. ? Speaking. ? Thinking.  Loss of appetite.  Seeing, hearing, tasting, smelling, or feeling things that are not real (hallucinations). How is this diagnosed? This condition may be diagnosed based on:  Your symptoms and medical history.  Your sleeping habits. Your health care provider may ask you to write down your sleeping habits in a daily sleep log, along with any symptoms you have.  A series of tests that are done while you sleep (sleep study or polysomnogram).  A test that  measures how quickly you can fall asleep during the day (daytime nap study or multiple sleep latency test). How is this treated? Treatment can help you manage your condition. Treatment may include:  Following a regular sleep routine.  Lifestyle changes, such as changing your eating habits, getting regular exercise, and avoiding alcohol or caffeinated beverages.  Taking medicines to make you more alert (stimulants) during the day.  Treating any underlying medical causes of hypersomnia. Follow these instructions at home: Sleep routine   Schedule the same bedtime and wake-up time each day.  Practice a relaxing bedtime routine. This may include reading, meditation, deep breathing, or taking a warm bath before going to sleep.  Get regular exercise each day. Avoid strenuous exercise in the evening hours.  Keep your sleep environment at a cooler temperature, darkened, and quiet.  Sleep with pillows and a mattress that are comfortable and supportive.  Schedule short 20-minute naps for when you feel sleepiest during the day.  Talk with your employer or teachers about your hypersomnia. If possible, adjust your schedule so that: ? You have a regular daytime work schedule. ? You can take a scheduled nap during the day. ? You do not have to work or be active at night.  Do not eat a heavy meal for a few hours before bedtime. Eat your meals at about the same times every day.  Avoid drinking alcohol or caffeinated beverages. Safety   Do not drive or use heavy machinery if you are sleepy. Ask your health care provider if it is safe for you to drive.  Wear a  life jacket when swimming or spending time near water. General instructions  Take supplements and over-the-counter and prescription medicines only as told by your health care provider.  Keep a sleep log that will help your doctor manage your condition. This may include information about: ? What time you go to bed each night. ? How  often you wake up at night. ? How many hours you sleep at night. ? How often and for how long you nap during the day. ? Any observations from others, such as leg movements during sleep, sleep walking, or snoring.  Keep all follow-up visits as told by your health care provider. This is important. Contact a health care provider if:  You have new symptoms.  Your symptoms get worse. Get help right away if:  You have serious thoughts about hurting yourself or someone else. If you ever feel like you may hurt yourself or others, or have thoughts about taking your own life, get help right away. You can go to your nearest emergency department or call:  Your local emergency services (911 in the [place]  A suicide crisis helpline, such as the National Suicide Prevention Lifeline at 240-545-9219. This is open 24 hours a day. Summary  Hypersomnia refers to a condition in which you feel very tired during the day even though you get plenty of sleep at night.  A person with this condition may take naps during the day and may find it very difficult to wake up from sleep.  Hypersomnia may affect a person's ability to think, concentrate, drive, or remember things.  Treatment, such as following a regular sleep routine and making some lifestyle changes, can help you manage your condition. This information is not intended to replace advice given to you by your health care provider. Make sure you discuss any questions you have with your health care provider. Document Revised: 04/28/2017 Document Reviewed: 04/28/2017 Elsevier Patient Education  2020 [employer]   Insomnia Insomnia is a sleep disorder that makes it difficult to fall asleep or stay asleep. Insomnia can cause fatigue, low energy, difficulty concentrating, mood swings, and poor performance at work or school. There are three different ways to classify insomnia:  Difficulty falling asleep.  Difficulty staying asleep.  Waking up too  early in the morning. Any type of insomnia can be long-term (chronic) or short-term (acute). Both are common. Short-term insomnia usually lasts for three months or less. Chronic insomnia occurs at least three times a week for longer than three months. What are the causes? Insomnia may be caused by another condition, situation, or substance, such as:  Anxiety.  Certain medicines.  Gastroesophageal reflux disease (GERD) or other gastrointestinal conditions.  Asthma or other breathing conditions.  Restless legs syndrome, sleep apnea, or other sleep disorders.  Chronic pain.  Menopause.  Stroke.  Abuse of alcohol, tobacco, or illegal drugs.  Mental health conditions, such as depression.  Caffeine.  Neurological disorders, such as Alzheimer's disease.  An overactive thyroid (hyperthyroidism). Sometimes, the cause of insomnia may not be known. What increases the risk? Risk factors for insomnia include:  Gender. Women are affected more often than men.  Age. Insomnia is more common as you get older.  Stress.  Lack of exercise.  Irregular work schedule or working night shifts.  Traveling between different time zones.  Certain medical and mental health conditions. What are the signs or symptoms? If you have insomnia, the main symptom is having trouble falling asleep or having trouble staying asleep. This may  lead to other symptoms, such as:  Feeling fatigued or having low energy.  Feeling nervous about going to sleep.  Not feeling rested in the morning.  Having trouble concentrating.  Feeling irritable, anxious, or depressed. How is this diagnosed? This condition may be diagnosed based on:  Your symptoms and medical history. Your health care provider may ask about: ? Your sleep habits. ? Any medical conditions you have. ? Your mental health.  A physical exam. How is this treated? Treatment for insomnia depends on the cause. Treatment may focus on treating an  underlying condition that is causing insomnia. Treatment may also include:  Medicines to help you sleep.  Counseling or therapy.  Lifestyle adjustments to help you sleep better. Follow these instructions at home: Eating and drinking   Limit or avoid alcohol, caffeinated beverages, and cigarettes, especially close to bedtime. These can disrupt your sleep.  Do not eat a large meal or eat spicy foods right before bedtime. This can lead to digestive discomfort that can make it hard for you to sleep. Sleep habits   Keep a sleep diary to help you and your health care provider figure out what could be causing your insomnia. Write down: ? When you sleep. ? When you wake up during the night. ? How well you sleep. ? How rested you feel the next day. ? Any side effects of medicines you are taking. ? What you eat and drink.  Make your bedroom a dark, comfortable place where it is easy to fall asleep. ? Put up shades or blackout curtains to block light from outside. ? Use a white noise machine to block noise. ? Keep the temperature cool.  Limit screen use before bedtime. This includes: ? Watching TV. ? Using your smartphone, tablet, or computer.  Stick to a routine that includes going to bed and waking up at the same times every day and night. This can help you fall asleep faster. Consider making a quiet activity, such as reading, part of your nighttime routine.  Try to avoid taking naps during the day so that you sleep better at night.  Get out of bed if you are still awake after 15 minutes of trying to sleep. Keep the lights down, but try reading or doing a quiet activity. When you feel sleepy, go back to bed. General instructions  Take over-the-counter and prescription medicines only as told by your health care provider.  Exercise regularly, as told by your health care provider. Avoid exercise starting several hours before bedtime.  Use relaxation techniques to manage stress. Ask  your health care provider to suggest some techniques that may work well for you. These may include: ? Breathing exercises. ? Routines to release muscle tension. ? Visualizing peaceful scenes.  Make sure that you drive carefully. Avoid driving if you feel very sleepy.  Keep all follow-up visits as told by your health care provider. This is important. Contact a health care provider if:  You are tired throughout the day.  You have trouble in your daily routine due to sleepiness.  You continue to have sleep problems, or your sleep problems get worse. Get help right away if:  You have serious thoughts about hurting yourself or someone else. If you ever feel like you may hurt yourself or others, or have thoughts about taking your own life, get help right away. You can go to your nearest emergency department or call:  Your local emergency services (911 in the [place]  A suicide crisis  helpline, such as the [place] at (514)123-6866. This is open 24 hours a day. Summary  Insomnia is a sleep disorder that makes it difficult to fall asleep or stay asleep.  Insomnia can be long-term (chronic) or short-term (acute).  Treatment for insomnia depends on the cause. Treatment may focus on treating an underlying condition that is causing insomnia.  Keep a sleep diary to help you and your health care provider figure out what could be causing your insomnia. This information is not intended to replace advice given to you by your health care provider. Make sure you discuss any questions you have with your health care provider. Document Revised: 04/08/2017 Document Reviewed: 02/03/2017 Elsevier Patient Education  2020 [employer].

## 2019-10-31 ENCOUNTER — Telehealth: Payer: Self-pay | Admitting: Family Medicine

## 2019-10-31 DIAGNOSIS — G4711 Idiopathic hypersomnia with long sleep time: Secondary | ICD-10-CM

## 2019-10-31 DIAGNOSIS — G4719 Other hypersomnia: Secondary | ICD-10-CM

## 2019-10-31 MED ORDER — MODAFINIL 200 MG PO TABS: ORAL_TABLET | ORAL | 1 refills | Status: DC

## 2019-10-31 NOTE — Telephone Encounter (Signed)
Spoke with pt. 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 She said in the past she would notice this while driving in the afternoon but now she notices it during the day since school is out. She recalled in the past she was told she could take an extra half pill if needed so she is wondering what the provider recommends now.

## 2019-10-31 NOTE — Addendum Note (Signed)
Addended byNicholas Lose, Detrick Dani L on: 10/31/2019 12:30 PM   Modules accepted: Orders

## 2019-10-31 NOTE — Telephone Encounter (Signed)
Spoke with pt and advised her of the change to Rx Amy authorized. Pt will take 1 Modafinil tablet every morning and then a 1/2 tablet at lunchtime if needed. She verbalized understanding and appreciation.

## 2019-10-31 NOTE — Telephone Encounter (Signed)
I have updated prescription. She can take 1 in the mornings and 1/2 tablet at lunch as needed.

## 2019-10-31 NOTE — Telephone Encounter (Signed)
Pt called stating that the modafinil (PROVIGIL) 200 MG tablet is not working for her and she would like to be advised on what else can be taken.

## 2020-01-01 ENCOUNTER — Other Ambulatory Visit: Payer: Self-pay | Admitting: Neurology

## 2020-01-01 ENCOUNTER — Telehealth: Payer: Self-pay | Admitting: Family Medicine

## 2020-01-01 DIAGNOSIS — G4711 Idiopathic hypersomnia with long sleep time: Secondary | ICD-10-CM

## 2020-01-01 DIAGNOSIS — F5101 Primary insomnia: Secondary | ICD-10-CM

## 2020-01-01 DIAGNOSIS — G4719 Other hypersomnia: Secondary | ICD-10-CM

## 2020-01-01 MED ORDER — MODAFINIL 200 MG PO TABS: ORAL_TABLET | ORAL | 1 refills | Status: DC

## 2020-01-01 MED ORDER — ESZOPICLONE 2 MG PO TABS: ORAL_TABLET | ORAL | 1 refills | Status: DC

## 2020-01-01 NOTE — Telephone Encounter (Signed)
Per [place] registry  Last fill date  11/01/19 for modafinil 200mg  45 tabs for 30days - AL np 10/10/19 for modafinil 200mg  90 tabs for 90days- [place] 10/10/19 for ESZOPICLONE 2mg  90abs for 90days- [place]  Last OV with [place] NP on 10/10/2019 82yr fu has not been scheduled yet   Requesting refill on  eszopiclone (LUNESTA) 2 MG TABS  To [facility] # [id number]

## 2020-01-01 NOTE — Telephone Encounter (Signed)
Pt has called to report that when responding to the refill request please add a cover sheet

## 2020-01-01 NOTE — Telephone Encounter (Signed)
[facility] # [id number] -  has called to report pt has 0 refills on the eszopiclone (LUNESTA) 2 MG TABS tablet  Pt does need refills, please call

## 2020-01-01 NOTE — Addendum Note (Signed)
Addended by: Shawnie Dapper L on: 01/01/2020 04:34 PM   Modules accepted: Orders

## 2020-04-22 ENCOUNTER — Other Ambulatory Visit: Payer: Self-pay | Admitting: Obstetrics and Gynecology

## 2020-04-22 DIAGNOSIS — Z9189 Other specified personal risk factors, not elsewhere classified: Secondary | ICD-10-CM

## 2020-05-20 ENCOUNTER — Other Ambulatory Visit: Payer: Self-pay | Admitting: Family Medicine

## 2020-05-20 DIAGNOSIS — G4719 Other hypersomnia: Secondary | ICD-10-CM

## 2020-05-20 DIAGNOSIS — G4711 Idiopathic hypersomnia with long sleep time: Secondary | ICD-10-CM

## 2020-05-20 DIAGNOSIS — F5101 Primary insomnia: Secondary | ICD-10-CM

## 2020-05-20 MED ORDER — MODAFINIL 200 MG PO TABS
ORAL_TABLET | ORAL | 1 refills | Status: DC
Start: 2020-05-20 — End: 2020-11-18

## 2020-05-20 MED ORDER — ESZOPICLONE 2 MG PO TABS: ORAL_TABLET | ORAL | 1 refills | Status: DC

## 2020-05-20 NOTE — Telephone Encounter (Signed)
Pt. is requesting refills for eszopiclone (LUNESTA) 2 MG TABS tablet & modafinil (PROVIGIL) 200 MG tablet.   Pharmacy: [facility] # [phone]

## 2020-05-20 NOTE — Telephone Encounter (Addendum)
Pt has 6/24-21 #45, 01-02-20 #135, 03-31-20 #45.  For modafinil.

## 2020-05-20 NOTE — Addendum Note (Signed)
Addended by: Guy Begin on: 05/20/2020 12:08 PM   Modules accepted: Orders

## 2020-10-15 ENCOUNTER — Ambulatory Visit
Admission: RE | Admit: 2020-10-15 | Discharge: 2020-10-15 | Disposition: A | Discharge status: Home / Self Care | Payer: Commercial Managed Care - PPO | Source: Ambulatory Visit | Attending: Obstetrics and Gynecology | Admitting: Obstetrics and Gynecology

## 2020-10-15 ENCOUNTER — Other Ambulatory Visit: Payer: Self-pay

## 2020-10-15 DIAGNOSIS — Z9189 Other specified personal risk factors, not elsewhere classified: Secondary | ICD-10-CM

## 2020-10-15 MED ORDER — GADOBUTROL 1 MMOL/ML IV SOLN
9.0000 mL | Freq: Once | INTRAVENOUS | Status: AC | PRN
  Administered 2020-10-15: 9 mL via INTRAVENOUS

## 2020-10-16 ENCOUNTER — Other Ambulatory Visit: Payer: Self-pay | Admitting: Obstetrics and Gynecology

## 2020-10-16 DIAGNOSIS — R9389 Abnormal findings on diagnostic imaging of other specified body structures: Secondary | ICD-10-CM

## 2020-10-29 ENCOUNTER — Ambulatory Visit
Admission: RE | Admit: 2020-10-29 | Discharge: 2020-10-29 | Disposition: A | Discharge status: Home / Self Care | Payer: Commercial Managed Care - PPO | Source: Ambulatory Visit | Attending: Obstetrics and Gynecology | Admitting: Obstetrics and Gynecology

## 2020-10-29 ENCOUNTER — Other Ambulatory Visit (HOSPITAL_COMMUNITY): Payer: Self-pay | Admitting: Diagnostic Radiology

## 2020-10-29 ENCOUNTER — Other Ambulatory Visit: Payer: Self-pay

## 2020-10-29 DIAGNOSIS — R9389 Abnormal findings on diagnostic imaging of other specified body structures: Secondary | ICD-10-CM

## 2020-10-29 MED ORDER — GADOBUTROL 1 MMOL/ML IV SOLN: 10.0000 mL | Freq: Once | INTRAVENOUS | Status: DC | PRN

## 2020-10-31 HISTORY — PX: BREAST BIOPSY: SHX20

## 2020-11-18 ENCOUNTER — Other Ambulatory Visit: Payer: Self-pay | Admitting: Family Medicine

## 2020-11-18 DIAGNOSIS — G4719 Other hypersomnia: Secondary | ICD-10-CM

## 2020-11-18 DIAGNOSIS — G4711 Idiopathic hypersomnia with long sleep time: Secondary | ICD-10-CM

## 2020-11-18 DIAGNOSIS — F5101 Primary insomnia: Secondary | ICD-10-CM

## 2020-11-18 MED ORDER — MODAFINIL 200 MG PO TABS: ORAL_TABLET | ORAL | 1 refills | Status: DC

## 2020-11-18 MED ORDER — ESZOPICLONE 2 MG PO TABS: ORAL_TABLET | ORAL | 1 refills | Status: DC

## 2020-11-18 NOTE — Telephone Encounter (Signed)
Pt request refill eszopiclone (LUNESTA) 2 MG TABS tablet and modafinil (PROVIGIL) 200 MG tablet at [facility] # [id number]

## 2020-11-19 ENCOUNTER — Telehealth: Payer: Self-pay | Admitting: *Deleted

## 2020-11-19 NOTE — Telephone Encounter (Signed)
Submitted PA modafinil on CMM. Key: [id number] - PA Case ID: [id number] - Rx #: [id number]. Waiting on determination from [facility].

## 2020-11-19 NOTE — Telephone Encounter (Signed)
Request Reference Number: [id number]. MODAFINIL TAB 200MG  is approved through 11/19/2021. Your patient may now fill this prescription and it will be covered.

## 2020-11-20 ENCOUNTER — Ambulatory Visit: Payer: Commercial Managed Care - PPO | Admitting: Family Medicine

## 2021-03-11 ENCOUNTER — Ambulatory Visit (INDEPENDENT_AMBULATORY_CARE_PROVIDER_SITE_OTHER): Payer: Commercial Managed Care - PPO | Admitting: Family Medicine

## 2021-03-11 ENCOUNTER — Encounter: Payer: Self-pay | Admitting: Family Medicine

## 2021-03-11 VITALS — BP 143/82 | HR 81 | Ht 65.0 in | Wt 189.5 lb

## 2021-03-11 DIAGNOSIS — G4711 Idiopathic hypersomnia with long sleep time: Secondary | ICD-10-CM | POA: Diagnosis not present

## 2021-03-11 DIAGNOSIS — F5101 Primary insomnia: Secondary | ICD-10-CM | POA: Diagnosis not present

## 2021-03-11 DIAGNOSIS — G4719 Other hypersomnia: Secondary | ICD-10-CM

## 2021-03-11 MED ORDER — MODAFINIL 200 MG PO TABS: ORAL_TABLET | ORAL | 1 refills | Status: DC

## 2021-03-11 MED ORDER — ESZOPICLONE 2 MG PO TABS: ORAL_TABLET | ORAL | 1 refills | Status: DC

## 2021-03-11 NOTE — Patient Instructions (Signed)
Below is our plan:  We will continue Lunesta 2mg  at bedtime and modafinil 200mg  every morning.   Please make sure you are staying well hydrated. I recommend 50-60 ounces daily. Well balanced diet and regular exercise encouraged. Consistent sleep schedule with 6-8 hours recommended.   Please continue follow up with care team as directed.   Follow up with me in  year   You may receive a survey regarding today's visit. I encourage you to leave honest feed back as I do use this information to improve patient care. Thank you for seeing me today!

## 2021-03-11 NOTE — Progress Notes (Signed)
PATIENT: Melissa House DOB: 1971/05/03  REASON FOR VISIT: follow up HISTORY FROM: patient  Chief Complaint  Patient presents with   Follow-up    Rm 11, alone. Here for yearly hypersomnia f/u. Pt reports doing well. Pt continues to snore.       HISTORY OF PRESENT ILLNESS: 03/11/21 ALL: Melissa House returns for follow up for insomnia and hypersomnia. She continues Lunesta  at bedtime. She takes modafinil  daily in the mornings. She feels that she is doing well. She is sleeping fr 10p-6a. She wakes decently refreshed but takes modafinil soon after waking. She does snore but denies apneic events. She works full time as a Runner, broadcasting/film/video. Last refill 01/21/21 for 90 days.   10/10/2019 ALL:  Melissa House is a 50 y.o. female here House for follow up. She continues Lunesta and modafinil. She usually takes Lunesta around 9:30pm and wakes around 6am. She takes modafinil every morning. She feels that the combination works really well for her. She does endorse dry mouth but otherwise, tolerates medications well.   HISTORY: (copied from Dr Dohmeier's note on 07/12/2018)  07-12-2018, RV yearly. Melissa House has just send her eldest off to college.  She reports no major changes in her medical social or family history.  Patient does have a history of a chronic sleep disturbance but has managed rather well, insomnia is not related to pain or any underlying psychiatric illness.  Epworth Sleepiness Scale was endorsed at 6 points fatigue severity was endorsed at 0 points, the patient is basically here House for refills.   House is 06 July 2017 I have the pleasure of meeting with Melissa House, a 50 year old female patient with a history of sleep disturbances.  She states that she has been able now to get more sleep stay asleep longer and feels more refreshed and restored in the mornings.  She endorsed fatigue severity at 27 points of the Epworth Sleepiness Scale at 10 points.  Her sleep is less fragmented now.  She takes Celexa , synthroid, lunesta at night and arModafinil in daytime.  She gained a lot of weight over the last 2 years .      RV 04/13/2016: Melissa House is a 50 year old female with a history of sleep disturbance. She returns House for follow-up. In the past she has had a sleep study which was unremarkable. She is also been on several different sleep medications. Including Sonata, Ambien, amitriptyline, melatonin and currently Lunesta. She states that she continues to wake up several times during the night. She generally goes to bed around 10 and will usually wake up between 2 and 4 and then every hour after that. She states that she is extremely tired during the day. She uses modafinil to help her stay awake. Her Celexa was also increased to 10 mg and she states that may have been beneficial. She states that overall her sleep seems to have improved slightly over the years but she continues to wake up frequently and have excessive daytime sleepiness. The patient does watch TV before bedtime. She sleeps with her husband. She returns House for an evaluation.   HISTORY per Dr. Vickey Huger: Melissa House is a 50 y.o. female , seen here as a referral  from Dr.Schoenhoff for a sleep consultation,    Chief complaint according to patient :" Fragmented sleep since my children were born"   Melissa House reports that she has had sleep issues for well over 15 years, and she had always wondered  if her fragmented sleep was also related to her underlying thyroid condition. She reports that only with Ambien which he get 4 or 5 hours of consecutive sleep. Her primary care physician became Dr. Dimple Casey at the time and she did not want to continue Ambien indefinitely and therefore initiated a workup including a sleep study 5 years ago. This was performed at [facility]  and showed only that the patient did not have apnea or periodic limb movements but fragmented sleep. She remained on Ambien, by now for about 12 years.   She is also followed by Dr. Katrinka Blazing, an endocrinologist at [facility], and has felt that her thyroid condition is better controlled than ever. She feels overall very well -she's not depressed, she does not lack energy in daytime but she wonders why her sleep has not improved.  She reports being drowsy when driving - independent of how long the road trip is. She reports it his worse in the afternoon.   Sleep habits are as follows: She usually goes to the bedroom between 9:30 and 10 and reads for about half an hour before she drifts to sleep. She does not struggle to go to sleep as long as she takes Ambien, she will sleep for about 4 hours but then her sleep will become fragmented and she wakes up in shorter and shorter intervals. Her bedroom is described as cool, quiet and dark and she shares the bed with her husband. Her husband has not noted any abnormal breathing, dream enactment or kicking. She does not report vivid or bizarre dreaming. She sleeps mostly on her side but often wakes up on her back. She sleeps on one pillow only. She is not woken by the urge to urinate, but when she wakes up she was sometimes will have a bathroom break. She does not wake up with headaches nor from headaches. She remains unrefreshed and unrestored with the feeling of not getting enough sleep. She usually rises at 6 AM, she relies on an alarm but she is usually awake before. She feels that she gets enough hours of sleep but that the quality of sleep is lacking. Only when she has nasal congestion or a cold which she breathes through her mouth and snores. In her review of systems she endorsed sleepiness, memory loss, the feeling of not enough sleep.   Sleep medical history and family sleep history:  No apnea history, no personal history of sleep walking or night terrors. No parental history of apnea.   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 She does not  drink alcohol, she does not use tobacco products. Patient reports being very sensitive to caffeine products as well as chocolate. 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     01-07-2016, I am meeting House with Melissa House after her recent polysomnography from 08/26/2015. I would like to start with a quotation from her Epworth score which she endorsed at 19 points and last visit at 16. Her sleep study revealed an AHI of 3.3 and only during REM sleep was clinically significant AHI of 16.2 in supine sleep 11.6. My recommendations were to avoid supine sleep and also to treat snoring. She may benefit from a dental device weight loss or a possible ENT surgery. However this very mild sleep apnea does not at all explained her excessive daytime sleepiness. She reports no vivid dreaming, no sleep paralysis. He may sometimes wake up sore but she does  not feel that her sleep is fragmented by discomfort or pain. Her sleep was fragmented in spite of taking Ambien but I could not find a physiological reason for it. He does sometimes naps but does not dream but nothing to her recollection. I suspect a level of anxiety- and still recommend avoiding supine sleep. Tennisball methode. She has tried melatonin without success.    Melissa House is here House reporting on 02/26/2016 her experience with Lunesta. She could not tolerate Sonata well, Lunesta helped her to initiate sleep and she can sleep for for perhaps 5 hours. A higher dose of 2 mg seem to help maintain sleep better and also helped her to reinitiate sleep if she awoke once at night. It also caused a slight hangover effect in the morning. She is concerned about not having deep and restorative sleep rather than having insomnia per se. Fatigue severity score 37, Epworth sleepiness score is 13. Her fear is that she is too sleepy to drive or could be too sleepy to drive safely the daytime stimulants have helped her to control sleepiness to some degree but  she still concerned about the afternoon hours when the effect wears off. Long discussion, start celexa at 10  Mg up from 5 mg.  Discussed amitryptilin as a sleep PRN.  Dry eyes and dry Mouth. Can take prn alternating with lunesta. Try lunesta 3 days a week , not dialy. Hypersomnia in daytime .   REVIEW OF SYSTEMS: Out of a complete 14 system review of symptoms, the patient complains only of the following symptoms, insomnia, excessive daytime sleepiness and all other reviewed systems are negative.  ESS: 6  ALLERGIES: No Known Allergies  HOME MEDICATIONS: Outpatient Medications Prior to Visit  Medication Sig Dispense Refill   citalopram (CELEXA) 10 MG tablet Take 10 mg by mouth daily.     hydrochlorothiazide (HYDRODIURIL) 12.5 MG tablet Take 1 tablet by mouth in the morning.     levothyroxine (SYNTHROID, LEVOTHROID) 125 MCG tablet Take 125 mcg by mouth daily.     losartan (COZAAR) 25 MG tablet Take 1 tablet by mouth daily.     norethindrone-ethinyl estradiol-iron (MICROGESTIN FE,GILDESS FE,LOESTRIN FE) 1.5-30 MG-MCG tablet Take 1 tablet by mouth daily.     eszopiclone (LUNESTA) 2 MG TABS tablet TAKE ONE TABLET BY MOUTH AT BEDTIME AS NEEDED FOR SLEEP.  Must keep follow up 03/11/21 for ongoing refills 90 tablet 1   modafinil (PROVIGIL) 200 MG tablet Take 1 tablet (200mg ) every morning. May take 1/2 tablet (100mg ) at lunch if needed. Must keep follow up 03/11/21 for ongoing refills 135 tablet 1   No facility-administered medications prior to visit.    PAST MEDICAL HISTORY: Past Medical History:  Diagnosis Date   Asthma    Depression    Ectopic pregnancy 1999   Hypothyroidism    Seasonal allergies     PAST SURGICAL HISTORY: Past Surgical History:  Procedure Laterality Date   ECTOPIC PREGNANCY SURGERY     SHOULDER SURGERY  1992   Left   UPPER GASTROINTESTINAL ENDOSCOPY  06/14/2011    FAMILY HISTORY: Family History  Problem Relation Age of Onset   Hypertension Mother     Hypercholesterolemia Mother    Hypertension Father    Lymphoma Father        NHL   Colon cancer Neg Hx    Esophageal cancer Neg Hx    Rectal cancer Neg Hx    Stomach cancer Neg Hx    Colon polyps Neg Hx  SOCIAL HISTORY: Social History   Socioeconomic History   Marital status: Married    Spouse name: Not on file   Number of children: 2   Years of education: 16   Highest education level: Not on file  Occupational History   Occupation: Magazine features editor: Nurse, mental health [employer]  Tobacco Use   Smoking status: Never   Smokeless tobacco: Never  Vaping Use   Vaping Use: Never used  Substance and Sexual Activity   Alcohol use: Yes    Comment: occasional   Drug use: No   Sexual activity: Not on file  Other Topics Concern   Not on file  Social History Narrative   Lives at home with husband, children    Caffeine use- 1-2 a week   Social Determinants of Health   Financial Resource Strain: Not on file  Food Insecurity: Not on file  Transportation Needs: Not on file  Physical Activity: Not on file  Stress: Not on file  Social Connections: Not on file  Intimate Partner Violence: Not on file      PHYSICAL EXAM  Vitals:   03/11/21 0809  BP: (!) 143/82  Pulse: 81  Weight: 189 lb 8 oz (86 kg)  Height: 5\' 5"  (1.651 m)    Body mass index is 31.53 kg/m.  Generalized: Well developed, in no acute distress  Cardiology: normal rate and rhythm, no murmur noted Respiratory: clear to auscultation bilaterally  Neurological examination  Mentation: Alert oriented to time, place, history taking. Follows all commands speech and language fluent Cranial nerve II-XII: Pupils were equal round reactive to light. Extraocular movements were full, visual field were full on confrontational test. Facial sensation and strength were normal.  Motor: The motor testing reveals 5 over 5 strength of all 4 extremities. Good symmetric motor tone is noted throughout.  Gait and station:  Gait is normal.    DIAGNOSTIC DATA (LABS, IMAGING, TESTING) - I reviewed patient records, labs, notes, testing and imaging myself where available.  No flowsheet data found.   No results found for: WBC, HGB, HCT, MCV, PLT No results found for: NA, K, CL, CO2, GLUCOSE, BUN, CREATININE, CALCIUM, PROT, ALBUMIN, AST, ALT, ALKPHOS, BILITOT, GFRNONAA, GFRAA No results found for: CHOL, HDL, LDLCALC, LDLDIRECT, TRIG, CHOLHDL No results found for: No results found for: VITAMINB12 No results found for: TSH     ASSESSMENT AND PLAN 50 y.o. year old female  has a past medical history of Asthma, Depression, Ectopic pregnancy (1999), Hypothyroidism, and Seasonal allergies. here with     ICD-10-CM   1. Hypersomnia, idiopathic  G47.11 modafinil (PROVIGIL) 200 MG tablet    2. Primary insomnia  F51.01 eszopiclone (LUNESTA) 2 MG TABS tablet    3. Excessive daytime sleepiness  G47.19 modafinil (PROVIGIL) 200 MG tablet       Yoselin continues to do well on Lunesta 2 mg every night and modafinil 200 mg daily.  She feels that insomnia and daytime sleepiness are well managed on this combination of medications.  She is followed regularly by primary care.  Healthy lifestyle habits encouraged.  I have refilled medications for 6 months. PMP aware reviewed and appropriate refills received in the past. She will follow-up with [facility] in 1 year, sooner if needed.  She verbalizes understanding and agreement with this plan.   No orders of the defined types were placed in this encounter.    Meds ordered this encounter  Medications   eszopiclone (LUNESTA) 2 MG TABS tablet  Sig: TAKE ONE TABLET BY MOUTH AT BEDTIME AS NEEDED FOR SLEEP.    Dispense:  90 tablet    Refill:  1    Keep on file for future refills, due 04/22/2021.    Order Specific Question:   Supervising Provider    Answer:   Anson Fret [[id number]]   modafinil (PROVIGIL) 200 MG tablet    Sig: Take 1 tablet (200mg ) every morning. May take 1/2  tablet (100mg ) at lunch if needed.    Dispense:  90 tablet    Refill:  1    Keep on file for future refills, due 04/22/21    Order Specific Question:   Supervising Provider    Answer:   04/24/21       Cylus Douville Anson Fret, FNP-C 03/11/2021, 8:31 AM [facility] [address], [address] [place], [address] [place] [phone]

## 2021-04-15 ENCOUNTER — Other Ambulatory Visit: Payer: Self-pay

## 2021-04-15 DIAGNOSIS — G4719 Other hypersomnia: Secondary | ICD-10-CM

## 2021-04-15 DIAGNOSIS — G4711 Idiopathic hypersomnia with long sleep time: Secondary | ICD-10-CM

## 2021-04-15 DIAGNOSIS — F5101 Primary insomnia: Secondary | ICD-10-CM

## 2021-04-15 MED ORDER — MODAFINIL 200 MG PO TABS: ORAL_TABLET | ORAL | 1 refills | Status: DC

## 2021-04-15 MED ORDER — ESZOPICLONE 2 MG PO TABS: ORAL_TABLET | ORAL | 1 refills | Status: DC

## 2021-04-21 ENCOUNTER — Telehealth: Payer: Self-pay | Admitting: Family Medicine

## 2021-04-21 NOTE — Telephone Encounter (Signed)
Pt called states she received  a call from [facility] regarding the day supply for the eszopiclone (LUNESTA) 2 MG TABS tablet is wrong. She states there was a -1 instead of a 1, and  a new prescription is needing to be sent.

## 2021-04-21 NOTE — Telephone Encounter (Signed)
Called [facility] at [phone]. Spoke w/ Harvie Heck. She was unable to locate pt under name,phone number, middle name "Rotberg" or insurance ID: [id number]. She searched another system and located her.   Relayed we sent Lunesta 2mg  #90, 1 refill on 04/15/21. Was able to locate prescription. Checked drug registry. She last refilled rx 01/21/21 #90. She should be able to fill on or after 04/21/21. Randy transferred me to pharmacist to give VO to process rx today. Spoke w/ 04/23/21. He will process rx today. Also gave VO for them to process rx modafinil. Nothing further needed.

## 2021-08-06 ENCOUNTER — Other Ambulatory Visit: Payer: Self-pay | Admitting: Nurse Practitioner

## 2021-08-06 DIAGNOSIS — N644 Mastodynia: Secondary | ICD-10-CM

## 2021-08-10 ENCOUNTER — Other Ambulatory Visit: Payer: Self-pay | Admitting: Obstetrics and Gynecology

## 2021-08-10 DIAGNOSIS — Z803 Family history of malignant neoplasm of breast: Secondary | ICD-10-CM

## 2021-08-19 ENCOUNTER — Ambulatory Visit
Admission: RE | Admit: 2021-08-19 | Discharge: 2021-08-19 | Disposition: A | Discharge status: Home / Self Care | Payer: Commercial Managed Care - PPO | Source: Ambulatory Visit | Attending: Nurse Practitioner | Admitting: Nurse Practitioner

## 2021-08-19 DIAGNOSIS — N644 Mastodynia: Secondary | ICD-10-CM

## 2021-10-07 DIAGNOSIS — E039 Hypothyroidism, unspecified: Secondary | ICD-10-CM | POA: Diagnosis not present

## 2021-10-13 ENCOUNTER — Other Ambulatory Visit: Payer: Self-pay | Admitting: Family Medicine

## 2021-10-13 DIAGNOSIS — F5101 Primary insomnia: Secondary | ICD-10-CM

## 2021-10-13 DIAGNOSIS — G4711 Idiopathic hypersomnia with long sleep time: Secondary | ICD-10-CM

## 2021-10-13 DIAGNOSIS — G4719 Other hypersomnia: Secondary | ICD-10-CM

## 2021-10-13 MED ORDER — ESZOPICLONE 2 MG PO TABS: ORAL_TABLET | ORAL | 1 refills | Status: DC

## 2021-10-13 MED ORDER — MODAFINIL 200 MG PO TABS: ORAL_TABLET | ORAL | 1 refills | Status: DC

## 2021-10-13 NOTE — Telephone Encounter (Signed)
LVM letting pt know rx called in as requested.

## 2021-10-13 NOTE — Telephone Encounter (Signed)
Pt request refill for eszopiclone (LUNESTA) 2 MG TABS tablet and modafinil (PROVIGIL) 200 MG tablet at [facility] # [id number]  Would like a 90-day supply.  Would like a call from the nurse when refill has been sent.

## 2021-10-14 DIAGNOSIS — F411 Generalized anxiety disorder: Secondary | ICD-10-CM | POA: Diagnosis not present

## 2021-10-14 DIAGNOSIS — Z0000 Encounter for general adult medical examination without abnormal findings: Secondary | ICD-10-CM | POA: Diagnosis not present

## 2021-10-14 DIAGNOSIS — F3342 Major depressive disorder, recurrent, in full remission: Secondary | ICD-10-CM | POA: Diagnosis not present

## 2021-10-14 DIAGNOSIS — I10 Essential (primary) hypertension: Secondary | ICD-10-CM | POA: Diagnosis not present

## 2021-10-14 DIAGNOSIS — Z79899 Other long term (current) drug therapy: Secondary | ICD-10-CM | POA: Diagnosis not present

## 2021-10-14 DIAGNOSIS — E039 Hypothyroidism, unspecified: Secondary | ICD-10-CM | POA: Diagnosis not present

## 2021-10-23 DIAGNOSIS — E039 Hypothyroidism, unspecified: Secondary | ICD-10-CM | POA: Diagnosis not present

## 2022-01-28 DIAGNOSIS — Z23 Encounter for immunization: Secondary | ICD-10-CM | POA: Diagnosis not present

## 2022-02-17 DIAGNOSIS — E039 Hypothyroidism, unspecified: Secondary | ICD-10-CM | POA: Diagnosis not present

## 2022-02-17 DIAGNOSIS — I10 Essential (primary) hypertension: Secondary | ICD-10-CM | POA: Diagnosis not present

## 2022-03-08 NOTE — Patient Instructions (Signed)
Below is our plan:  We will continue Lunesta and modafinil.   Please make sure you are staying well hydrated. I recommend 50-60 ounces daily. Well balanced diet and regular exercise encouraged. Consistent sleep schedule with 6-8 hours recommended.   Please continue follow up with care team as directed.   Follow up with me in 1 year   You may receive a survey regarding today's visit. I encourage you to leave honest feed back as I do use this information to improve patient care. Thank you for seeing me today!

## 2022-03-08 NOTE — Progress Notes (Unsigned)
PATIENT: Melissa House DOB: Jun 06, 1970  REASON FOR VISIT: follow up HISTORY FROM: patient  No chief complaint on file.   HISTORY OF PRESENT ILLNESS:  03/08/22 ALL: Melissa House returns for follow up for insomnia and hypersomnia. She continues Lunesta 2mg  QHS and modafinil 200mg  QD.   03/11/2021 ALL: Melissa House returns for follow up for insomnia and hypersomnia. She continues Lunesta 2mg  at bedtime. She takes modafinil 200mg  daily in the mornings. She feels that she is doing well. She is sleeping fr 10p-6a. She wakes decently refreshed but takes modafinil soon after waking. She does snore but denies apneic events. She works full time as a Pharmacist, hospital. Last refill 01/21/21 for 90 days.   10/10/2019 ALL:  [name] is a 51 y.o. female here today for follow up. She continues Lunesta and modafinil. She usually takes Lunesta around 9:30pm and wakes around 6am. She takes modafinil every morning. She feels that the combination works really well for her. She does endorse dry mouth but otherwise, tolerates medications well.   HISTORY: (copied from Dr Dohmeier's note on 07/12/2018)  07-12-2018, RV yearly. Melissa House has just send her eldest off to college.  She reports no major changes in her medical social or family history.  Patient does have a history of a chronic sleep disturbance but has managed rather well, insomnia is not related to pain or any underlying psychiatric illness.  Epworth Sleepiness Scale was endorsed at 6 points fatigue severity was endorsed at 0 points, the patient is basically here today for refills.   Today is 06 July 2017 I have the pleasure of meeting with Melissa House today, a 51 year old female patient with a history of sleep disturbances.  She states that she has been able now to get more sleep stay asleep longer and feels more refreshed and restored in the mornings.  She endorsed fatigue severity at 27 points of the Epworth Sleepiness Scale at 10 points.  Her sleep is less  fragmented now. She takes Celexa , synthroid, lunesta at night and arModafinil in daytime.  She gained a lot of weight over the last 2 years .      RV 04/13/2016: Melissa House is a 51 year old female with a history of sleep disturbance. She returns today for follow-up. In the past she has had a sleep study which was unremarkable. She is also been on several different sleep medications. Including Sonata, Ambien, amitriptyline, melatonin and currently Lunesta. She states that she continues to wake up several times during the night. She generally goes to bed around 10 and will usually wake up between 2 and 4 and then every hour after that. She states that she is extremely tired during the day. She uses modafinil to help her stay awake. Her Celexa was also increased to 10 mg and she states that may have been beneficial. She states that overall her sleep seems to have improved slightly over the years but she continues to wake up frequently and have excessive daytime sleepiness. The patient does watch TV before bedtime. She sleeps with her husband. She returns today for an evaluation.   HISTORY per Dr. Brett Fairy: Melissa House is a 51 y.o. female , seen here as a referral  from [place] for a sleep consultation,    Chief complaint according to patient :" Fragmented sleep since my children were born"   [name] reports that she has had sleep issues for well over 15 years, and she had always wondered if her fragmented sleep  was also related to her underlying thyroid condition. She reports that only with Ambien which he get 4 or 5 hours of consecutive sleep. Her primary care physician became Dr. Dimple Casey at the time and she did not want to continue Ambien indefinitely and therefore initiated a workup including a sleep study 5 years ago. This was performed at [facility]  and showed only that the patient did not have apnea or periodic limb movements but fragmented sleep. She remained on Ambien, by now for  about 12 years.  She is also followed by Dr. Katrinka Blazing, an endocrinologist at [facility], and has felt that her thyroid condition is better controlled than ever. She feels overall very well -she's not depressed, she does not lack energy in daytime but she wonders why her sleep has not improved.  She reports being drowsy when driving - independent of how long the road trip is. She reports it his worse in the afternoon.   Sleep habits are as follows: She usually goes to the bedroom between 9:30 and 10 and reads for about half an hour before she drifts to sleep. She does not struggle to go to sleep as long as she takes Ambien, she will sleep for about 4 hours but then her sleep will become fragmented and she wakes up in shorter and shorter intervals. Her bedroom is described as cool, quiet and dark and she shares the bed with her husband. Her husband has not noted any abnormal breathing, dream enactment or kicking. She does not report vivid or bizarre dreaming. She sleeps mostly on her side but often wakes up on her back. She sleeps on one pillow only. She is not woken by the urge to urinate, but when she wakes up she was sometimes will have a bathroom break. She does not wake up with headaches nor from headaches. She remains unrefreshed and unrestored with the feeling of not getting enough sleep. She usually rises at 6 AM, she relies on an alarm but she is usually awake before. She feels that she gets enough hours of sleep but that the quality of sleep is lacking. Only when she has nasal congestion or a cold which she breathes through her mouth and snores. In her review of systems she endorsed sleepiness, memory loss, the feeling of not enough sleep.   Sleep medical history and family sleep history:  No apnea history, no personal history of sleep walking or night terrors. No parental history of apnea.   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  She does not drink alcohol, she does not use tobacco products. Patient reports being very sensitive to caffeine products as well as chocolate. 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     01-07-2016, I am meeting today with Mrs. Sandy after her recent polysomnography from 08/26/2015. I would like to start with a quotation from her Epworth score which she endorsed at 19 points and last visit at 16. Her sleep study revealed an AHI of 3.3 and only during REM sleep was clinically significant AHI of 16.2 in supine sleep 11.6. My recommendations were to avoid supine sleep and also to treat snoring. She may benefit from a dental device weight loss or a possible ENT surgery. However this very mild sleep apnea does not at all explained her excessive daytime sleepiness. She reports no vivid dreaming, no sleep paralysis. He may sometimes wake up sore but she does not feel that her  sleep is fragmented by discomfort or pain. Her sleep was fragmented in spite of taking Ambien but I could not find a physiological reason for it. He does sometimes naps but does not dream but nothing to her recollection. I suspect a level of anxiety- and still recommend avoiding supine sleep. Tennisball methode. She has tried melatonin without success.    Mrs. Tugwell is here today reporting on 02/26/2016 her experience with Lunesta. She could not tolerate Sonata well, Lunesta helped her to initiate sleep and she can sleep for for perhaps 5 hours. A higher dose of 2 mg seem to help maintain sleep better and also helped her to reinitiate sleep if she awoke once at night. It also caused a slight hangover effect in the morning. She is concerned about not having deep and restorative sleep rather than having insomnia per se. Fatigue severity score 37, Epworth sleepiness score is 13. Her fear is that she is too sleepy to drive or could be too sleepy to drive safely the daytime stimulants have helped her to control sleepiness to  some degree but she still concerned about the afternoon hours when the effect wears off. Long discussion, start celexa at 10  Mg up from 5 mg.  Discussed amitryptilin as a sleep PRN.  Dry eyes and dry Mouth. Can take prn alternating with lunesta. Try lunesta 3 days a week , not dialy. Hypersomnia in daytime .   REVIEW OF SYSTEMS: Out of a complete 14 system review of symptoms, the patient complains only of the following symptoms, insomnia, excessive daytime sleepiness and all other reviewed systems are negative.  ESS: 6  ALLERGIES: No Known Allergies  HOME MEDICATIONS: Outpatient Medications Prior to Visit  Medication Sig Dispense Refill   citalopram (CELEXA) 10 MG tablet Take 10 mg by mouth daily.     eszopiclone (LUNESTA) 2 MG TABS tablet TAKE ONE TABLET BY MOUTH AT BEDTIME AS NEEDED FOR SLEEP. 90 tablet 1   hydrochlorothiazide (HYDRODIURIL) 12.5 MG tablet Take 1 tablet by mouth in the morning.     levothyroxine (SYNTHROID, LEVOTHROID) 125 MCG tablet Take 125 mcg by mouth daily.     losartan (COZAAR) 25 MG tablet Take 1 tablet by mouth daily.     modafinil (PROVIGIL) 200 MG tablet Take 1 tablet (200mg ) every morning. May take 1/2 tablet (100mg ) at lunch if needed. 90 tablet 1   norethindrone-ethinyl estradiol-iron (MICROGESTIN FE,GILDESS FE,LOESTRIN FE) 1.5-30 MG-MCG tablet Take 1 tablet by mouth daily.     No facility-administered medications prior to visit.    PAST MEDICAL HISTORY: Past Medical History:  Diagnosis Date   Asthma    Depression    Ectopic pregnancy 1999   Hypothyroidism    Seasonal allergies     PAST SURGICAL HISTORY: Past Surgical History:  Procedure Laterality Date   BREAST BIOPSY Right 10/31/2020   ECTOPIC PREGNANCY SURGERY     SHOULDER SURGERY  1992   Left   UPPER GASTROINTESTINAL ENDOSCOPY  06/14/2011    FAMILY HISTORY: Family History  Problem Relation Age of Onset   Hypertension Mother    Hypercholesterolemia Mother    Hypertension Father     Lymphoma Father        NHL   Colon cancer Neg Hx    Esophageal cancer Neg Hx    Rectal cancer Neg Hx    Stomach cancer Neg Hx    Colon polyps Neg Hx     SOCIAL HISTORY: Social History   Socioeconomic History   Marital  status: Married    Spouse name: Not on file   Number of children: 2   Years of education: 16   Highest education level: Not on file  Occupational History   Occupation: Magazine features editor: Nurse, mental health [employer]  Tobacco Use   Smoking status: Never   Smokeless tobacco: Never  Vaping Use   Vaping Use: Never used  Substance and Sexual Activity   Alcohol use: Yes    Comment: occasional   Drug use: No   Sexual activity: Not on file  Other Topics Concern   Not on file  Social History Narrative   Lives at home with husband, children    Caffeine use- 1-2 a week   Social Determinants of Health   Financial Resource Strain: Not on file  Food Insecurity: Not on file  Transportation Needs: Not on file  Physical Activity: Not on file  Stress: Not on file  Social Connections: Not on file  Intimate Partner Violence: Not on file      PHYSICAL EXAM  There were no vitals filed for this visit.   There is no height or weight on file to calculate BMI.  Generalized: Well developed, in no acute distress  Cardiology: normal rate and rhythm, no murmur noted Respiratory: clear to auscultation bilaterally  Neurological examination  Mentation: Alert oriented to time, place, history taking. Follows all commands speech and language fluent Cranial nerve II-XII: Pupils were equal round reactive to light. Extraocular movements were full, visual field were full on confrontational test. Facial sensation and strength were normal.  Motor: The motor testing reveals 5 over 5 strength of all 4 extremities. Good symmetric motor tone is noted throughout.  Gait and station: Gait is normal.    DIAGNOSTIC DATA (LABS, IMAGING, TESTING) - I reviewed patient records,  labs, notes, testing and imaging myself where available.      No data to display           No results found for: "WBC", "HGB", "HCT", "MCV", "PLT" No results found for: "NA", "K", "CL", "CO2", "GLUCOSE", "BUN", "CREATININE", "CALCIUM", "PROT", "ALBUMIN", "AST", "ALT", "ALKPHOS", "BILITOT", "GFRNONAA", "GFRAA" No results found for: "CHOL", "HDL", "LDLCALC", "LDLDIRECT", "TRIG", "CHOLHDL" No results found for: "HGBA1C" No results found for: "VITAMINB12" No results found for: "TSH"     ASSESSMENT AND PLAN 51 y.o. year old female  has a past medical history of Asthma, Depression, Ectopic pregnancy (1999), Hypothyroidism, and Seasonal allergies. here with   No diagnosis found.    Miho continues to do well on Lunesta 2 mg every night and modafinil 200 mg daily.  She feels that insomnia and daytime sleepiness are well managed on this combination of medications.  She is followed regularly by primary care.  Healthy lifestyle habits encouraged.  I have refilled medications for 6 months. PMP aware reviewed and appropriate refills received in the past. She will follow-up with [place] in 1 year, sooner if needed.  She verbalizes understanding and agreement with this plan.   No orders of the defined types were placed in this encounter.     No orders of the defined types were placed in this encounter.      Shawnie Dapper, FNP-C 03/08/2022, 8:41 AM [facility] [address] [place], [place] [address] [phone]

## 2022-03-11 ENCOUNTER — Ambulatory Visit (INDEPENDENT_AMBULATORY_CARE_PROVIDER_SITE_OTHER): Payer: BC Managed Care – PPO | Admitting: Family Medicine

## 2022-03-11 ENCOUNTER — Encounter: Payer: Self-pay | Admitting: Family Medicine

## 2022-03-11 VITALS — BP 130/81 | HR 80 | Ht 65.0 in | Wt 197.0 lb

## 2022-03-11 DIAGNOSIS — G4711 Idiopathic hypersomnia with long sleep time: Secondary | ICD-10-CM

## 2022-03-11 DIAGNOSIS — G4719 Other hypersomnia: Secondary | ICD-10-CM | POA: Diagnosis not present

## 2022-03-11 DIAGNOSIS — F5101 Primary insomnia: Secondary | ICD-10-CM

## 2022-03-11 MED ORDER — ESZOPICLONE 2 MG PO TABS: ORAL_TABLET | ORAL | 1 refills | Status: DC

## 2022-03-11 MED ORDER — MODAFINIL 200 MG PO TABS: ORAL_TABLET | ORAL | 1 refills | Status: DC

## 2022-07-27 ENCOUNTER — Other Ambulatory Visit: Payer: Self-pay | Admitting: Obstetrics and Gynecology

## 2022-07-27 DIAGNOSIS — Z803 Family history of malignant neoplasm of breast: Secondary | ICD-10-CM

## 2022-10-06 ENCOUNTER — Other Ambulatory Visit: Payer: Self-pay | Admitting: Family Medicine

## 2022-10-06 DIAGNOSIS — G4711 Idiopathic hypersomnia with long sleep time: Secondary | ICD-10-CM

## 2022-10-06 DIAGNOSIS — G4719 Other hypersomnia: Secondary | ICD-10-CM

## 2022-10-06 DIAGNOSIS — F5101 Primary insomnia: Secondary | ICD-10-CM

## 2022-10-06 MED ORDER — ESZOPICLONE 2 MG PO TABS: ORAL_TABLET | ORAL | 1 refills | Status: DC

## 2022-10-06 MED ORDER — MODAFINIL 200 MG PO TABS: ORAL_TABLET | ORAL | 1 refills | Status: DC

## 2022-10-06 NOTE — Telephone Encounter (Signed)
Pt is requesting a refill for eszopiclone (LUNESTA) 2 MG TABS tablet & modafinil (PROVIGIL) 200 MG tablet .  Pharmacy: [facility] # [phone]

## 2022-10-06 NOTE — Telephone Encounter (Signed)
Pt called back she received Kara Mead message below. Pt said she still has medication and she is not trying to get a early refills. Pt said in the past either our office or [facility] was not able to get Rx in time. Pt said [facility] won't dispense the medication to her early. She asked me to put a note of this.

## 2022-10-06 NOTE — Telephone Encounter (Signed)
Pt last seen 03/11/22 and next f/u 03/15/23.   Last refilled Lunesta 07/13/22 #90 and modafinil 07/13/22 #90.  LVM for pt letting her know requests were a little early. Amy Lomax,NP will refill this one time for her. Moving forward, we will have to refill on schedule.

## 2023-01-19 ENCOUNTER — Telehealth: Payer: Self-pay | Admitting: Family Medicine

## 2023-01-19 ENCOUNTER — Encounter: Payer: Self-pay | Admitting: Family Medicine

## 2023-01-19 NOTE — Telephone Encounter (Signed)
LVM and sent letter in mail informing pt of need to reschedule 03/15/23 appt - office closed

## 2023-03-01 ENCOUNTER — Encounter: Payer: Self-pay | Admitting: Family Medicine

## 2023-03-01 ENCOUNTER — Ambulatory Visit (INDEPENDENT_AMBULATORY_CARE_PROVIDER_SITE_OTHER): Payer: 59 | Admitting: Family Medicine

## 2023-03-01 VITALS — BP 113/72 | HR 83 | Ht 65.0 in | Wt 211.0 lb

## 2023-03-01 DIAGNOSIS — G4711 Idiopathic hypersomnia with long sleep time: Secondary | ICD-10-CM

## 2023-03-01 DIAGNOSIS — R0683 Snoring: Secondary | ICD-10-CM

## 2023-03-01 DIAGNOSIS — G4719 Other hypersomnia: Secondary | ICD-10-CM | POA: Diagnosis not present

## 2023-03-01 NOTE — Progress Notes (Signed)
PATIENT: Melissa House DOB: 11-07-1970  REASON FOR VISIT: follow up HISTORY FROM: patient  Chief Complaint  Patient presents with   Follow-up    Pt in room 1. Here for Hypersomnia, idiopathic follow up. Pt stopped Alfonso Patten was not working.  Pt is able to fall asleep, but can't stay asleep.    HISTORY OF PRESENT ILLNESS:  03/01/23 ALL: Melissa House returns for follow up for insomnia and hypersomnia. She was last seen 03/2022 and doing fairly well on Lunesta 2mg  QHS and modafinil 200mg  QD. She reports that over the past year, she has felt Lunesta doesn't work as well keep her asleep. She goes to sleep without difficulty but wakes multiple times at night. She is much more sleepy during the day. She has gained some weight. About 25lbs. She reports her daughter has told her she is snoring more. Sleep study in 2017 showed AHI 3.7/h, supine AHI 16.2. She would like to repeat testing. She reports adjustments to levothyroxine were made in 10/2022 and she hasn't felt well since. She sleeps about 8 hours at night. She could take multiple naps during the day. Falls asleep easily if sitting still. She has gotten really sleepy when driving. She has not fallen asleep at wheel, recently.   03/11/2022 ALL: Melissa House returns for follow up for insomnia and hypersomnia. She continues Lunesta 2mg  QHS and modafinil 200mg  QD. She feels that she is doing well. She is getting about 8 hours of sleep every night. BP is well managed. She is tolerating meds.   03/11/2021 ALL: Melissa House returns for follow up for insomnia and hypersomnia. She continues Lunesta 2mg  at bedtime. She takes modafinil 200mg  daily in the mornings. She feels that she is doing well. She is sleeping fr 10p-6a. She wakes decently refreshed but takes modafinil soon after waking. She does snore but denies apneic events. She works full time as a Runner, broadcasting/film/video. Last refill 01/21/21 for 90 days.   10/10/2019 ALL:  Melissa House is a 52 y.o. female here today for follow up.  She continues Lunesta and modafinil. She usually takes Lunesta around 9:30pm and wakes around 6am. She takes modafinil every morning. She feels that the combination works really well for her. She does endorse dry mouth but otherwise, tolerates medications well.   HISTORY: (copied from Dr Dohmeier's note on 07/12/2018)  07-12-2018, RV yearly. Melissa House has just send her eldest off to college.  She reports no major changes in her medical social or family history.  Patient does have a history of a chronic sleep disturbance but has managed rather well, insomnia is not related to pain or any underlying psychiatric illness.  Epworth Sleepiness Scale was endorsed at 6 points fatigue severity was endorsed at 0 points, the patient is basically here today for refills.   Today is 06 July 2017 I have the pleasure of meeting with Melissa House today, a 52 year old female patient with a history of sleep disturbances.  She states that she has been able now to get more sleep stay asleep longer and feels more refreshed and restored in the mornings.  She endorsed fatigue severity at 27 points of the Epworth Sleepiness Scale at 10 points.  Her sleep is less fragmented now. She takes Celexa , synthroid, lunesta at night and arModafinil in daytime.   She gained a lot of weight over the last 2 years .      RV 04/13/2016: Melissa House is a 52 year old female with a history of sleep disturbance. She  returns today for follow-up. In the past she has had a sleep study which was unremarkable. She is also been on several different sleep medications. Including Sonata, Ambien, amitriptyline, melatonin and currently Lunesta. She states that she continues to wake up several times during the night. She generally goes to bed around 10 and will usually wake up between 2 and 4 and then every hour after that. She states that she is extremely tired during the day. She uses modafinil to help her stay awake. Her Celexa was also increased to 10 mg  and she states that may have been beneficial. She states that overall her sleep seems to have improved slightly over the years but she continues to wake up frequently and have excessive daytime sleepiness. The patient does watch TV before bedtime. She sleeps with her husband. She returns today for an evaluation.   HISTORY per Dr. Vickey Huger: Melissa House is a 52 y.o. female , seen here as a referral  from Dr.Schoenhoff for a sleep consultation,    Chief complaint according to patient :" Fragmented sleep since my children were born"   Melissa House reports that she has had sleep issues for well over 15 years, and she had always wondered if her fragmented sleep was also related to her underlying thyroid condition. She reports that only with Ambien which he get 4 or 5 hours of consecutive sleep. Her primary care physician became Dr. Dimple Casey at the time and she did not want to continue Ambien indefinitely and therefore initiated a workup including a sleep study 5 years ago. This was performed at [facility]  and showed only that the patient did not have apnea or periodic limb movements but fragmented sleep. She remained on Ambien, by now for about 12 years.  She is also followed by Dr. Katrinka Blazing, an endocrinologist at [facility], and has felt that her thyroid condition is better controlled than ever. She feels overall very well -she's not depressed, she does not lack energy in daytime but she wonders why her sleep has not improved.  She reports being drowsy when driving - independent of how long the road trip is. She reports it his worse in the afternoon.   Sleep habits are as follows: She usually goes to the bedroom between 9:30 and 10 and reads for about half an hour before she drifts to sleep. She does not struggle to go to sleep as long as she takes Ambien, she will sleep for about 4 hours but then her sleep will become fragmented and she wakes up in shorter and shorter intervals. Her bedroom is  described as cool, quiet and dark and she shares the bed with her husband. Her husband has not noted any abnormal breathing, dream enactment or kicking. She does not report vivid or bizarre dreaming. She sleeps mostly on her side but often wakes up on her back. She sleeps on one pillow only. She is not woken by the urge to urinate, but when she wakes up she was sometimes will have a bathroom break. She does not wake up with headaches nor from headaches. She remains unrefreshed and unrestored with the feeling of not getting enough sleep. She usually rises at 6 AM, she relies on an alarm but she is usually awake before. She feels that she gets enough hours of sleep but that the quality of sleep is lacking. Only when she has nasal congestion or a cold which she breathes through her mouth and snores. In her review of  systems she endorsed sleepiness, memory loss, the feeling of not enough sleep.   Sleep medical history and family sleep history:  No apnea history, no personal history of sleep walking or night terrors. No parental history of apnea.   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 She does not drink alcohol, she does not use tobacco products. Patient reports being very sensitive to caffeine products as well as chocolate. 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     01-07-2016, I am meeting today with Melissa House after her recent polysomnography from 08/26/2015. I would like to start with a quotation from her Epworth score which she endorsed at 19 points and last visit at 16. Her sleep study revealed an AHI of 3.3 and only during REM sleep was clinically significant AHI of 16.2 in supine sleep 11.6. My recommendations were to avoid supine sleep and also to treat snoring. She may benefit from a dental device weight loss or a possible ENT surgery. However this very mild sleep apnea does not at all explained her excessive daytime  sleepiness. She reports no vivid dreaming, no sleep paralysis. He may sometimes wake up sore but she does not feel that her sleep is fragmented by discomfort or pain. Her sleep was fragmented in spite of taking Ambien but I could not find a physiological reason for it. He does sometimes naps but does not dream but nothing to her recollection. I suspect a level of anxiety- and still recommend avoiding supine sleep. Tennisball methode. She has tried melatonin without success.    Melissa House is here today reporting on 02/26/2016 her experience with Lunesta. She could not tolerate Sonata well, Lunesta helped her to initiate sleep and she can sleep for for perhaps 5 hours. A higher dose of 2 mg seem to help maintain sleep better and also helped her to reinitiate sleep if she awoke once at night. It also caused a slight hangover effect in the morning. She is concerned about not having deep and restorative sleep rather than having insomnia per se. Fatigue severity score 37, Epworth sleepiness score is 13. Her fear is that she is too sleepy to drive or could be too sleepy to drive safely the daytime stimulants have helped her to control sleepiness to some degree but she still concerned about the afternoon hours when the effect wears off. Long discussion, start celexa at 10  Mg up from 5 mg.  Discussed amitryptilin as a sleep PRN.  Dry eyes and dry Mouth. Can take prn alternating with lunesta. Try lunesta 3 days a week , not dialy. Hypersomnia in daytime .   REVIEW OF SYSTEMS: Out of a complete 14 system review of symptoms, the patient complains only of the following symptoms, insomnia, excessive daytime sleepiness and all other reviewed systems are negative.  ESS: 22/24, previously 5/24  ALLERGIES: No Known Allergies  HOME MEDICATIONS: Outpatient Medications Prior to Visit  Medication Sig Dispense Refill   estradiol-norethindrone (ACTIVELLA) 1-0.5 MG tablet Take 1 tablet by mouth daily.      levothyroxine (SYNTHROID, LEVOTHROID) 125 MCG tablet Take 125 mcg by mouth daily.     modafinil (PROVIGIL) 200 MG tablet Take 1 tablet (200mg ) every morning. May take 1/2 tablet (100mg ) at lunch if needed. 90 tablet 1   NIFEdipine (ADALAT CC) 30 MG 24 hr tablet Take 30 mg by mouth daily.     escitalopram (LEXAPRO) 20 MG tablet Take 20 mg by mouth  daily. (Patient not taking: Reported on 03/01/2023)     amLODipine (NORVASC) 5 MG tablet Take 5 mg by mouth daily. (Patient not taking: Reported on 03/01/2023)     eszopiclone (LUNESTA) 2 MG TABS tablet TAKE ONE TABLET BY MOUTH AT BEDTIME AS NEEDED FOR SLEEP. (Patient not taking: Reported on 03/01/2023) 90 tablet 1   hydrochlorothiazide (HYDRODIURIL) 12.5 MG tablet Take 1 tablet by mouth in the morning. (Patient not taking: Reported on 03/01/2023)     norethindrone-ethinyl estradiol-iron (MICROGESTIN FE,GILDESS FE,LOESTRIN FE) 1.5-30 MG-MCG tablet Take 1 tablet by mouth daily. (Patient not taking: Reported on 03/01/2023)     No facility-administered medications prior to visit.    PAST MEDICAL HISTORY: Past Medical History:  Diagnosis Date   Asthma    Depression    Ectopic pregnancy 1999   Hypothyroidism    Seasonal allergies     PAST SURGICAL HISTORY: Past Surgical History:  Procedure Laterality Date   BREAST BIOPSY Right 10/31/2020   ECTOPIC PREGNANCY SURGERY     SHOULDER SURGERY  1992   Left   UPPER GASTROINTESTINAL ENDOSCOPY  06/14/2011    FAMILY HISTORY: Family History  Problem Relation Age of Onset   Hypertension Mother    Hypercholesterolemia Mother    Hypertension Father    Lymphoma Father        NHL   Colon cancer Neg Hx    Esophageal cancer Neg Hx    Rectal cancer Neg Hx    Stomach cancer Neg Hx    Colon polyps Neg Hx     SOCIAL HISTORY: Social History   Socioeconomic History   Marital status: Married    Spouse name: Not on file   Number of children: 2   Years of education: 16   Highest education level: Not on  file  Occupational History   Occupation: Magazine features editor: Nurse, mental health [employer]  Tobacco Use   Smoking status: Never   Smokeless tobacco: Never  Vaping Use   Vaping status: Never Used  Substance and Sexual Activity   Alcohol use: Yes    Comment: occasional   Drug use: No   Sexual activity: Not on file  Other Topics Concern   Not on file  Social History Narrative   Lives at home with husband, children    Caffeine use- 1-2 a week   Social Determinants of Health   Financial Resource Strain: Not on file  Food Insecurity: Not on file  Transportation Needs: Not on file  Physical Activity: Not on file  Stress: Not on file  Social Connections: Not on file  Intimate Partner Violence: Not on file      PHYSICAL EXAM  Vitals:   03/01/23 1430  BP: 113/72  Pulse: 83  Weight: 211 lb (95.7 kg)  Height: 5\' 5"  (1.651 m)   Neck cir: 14.5" Mallampati 3  Body mass index is 35.11 kg/m.  Generalized: Well developed, in no acute distress  Cardiology: normal rate and rhythm, no murmur noted Respiratory: clear to auscultation bilaterally  Neurological examination  Mentation: Alert oriented to time, place, history taking. Follows all commands speech and language fluent Cranial nerve II-XII: Pupils were equal round reactive to light. Extraocular movements were full, visual field were full on confrontational test. Facial sensation and strength were normal.  Motor: The motor testing reveals 5 over 5 strength of all 4 extremities. Good symmetric motor tone is noted throughout.  Gait and station: Gait is normal.    DIAGNOSTIC DATA (LABS,  IMAGING, TESTING) - I reviewed patient records, labs, notes, testing and imaging myself where available.      No data to display           No results found for: "WBC", "HGB", "HCT", "MCV", "PLT" No results found for: "NA", "K", "CL", "CO2", "GLUCOSE", "BUN", "CREATININE", "CALCIUM", "PROT", "ALBUMIN", "AST", "ALT", "ALKPHOS",  "BILITOT", "GFRNONAA", "GFRAA" No results found for: "CHOL", "HDL", "LDLCALC", "LDLDIRECT", "TRIG", "CHOLHDL" No results found for: "HGBA1C" No results found for: "VITAMINB12" No results found for: "TSH"     ASSESSMENT AND PLAN 52 y.o. year old female  has a past medical history of Asthma, Depression, Ectopic pregnancy (1999), Hypothyroidism, and Seasonal allergies. here with     ICD-10-CM   1. Hypersomnia, idiopathic  G47.11 Home sleep test    2. Excessive daytime sleepiness  G47.19 Home sleep test    3. Snoring  R06.83 Home sleep test      Melissa House has discontinued Lunesta as it was ineffective. She is having significant daytime sleepiness and is snoring more in the setting of 25lb weight gain. We will continue modafinil 200 mg daily.  We will repeat sleep study to assess for sleep apnea. I have encouraged her to follow up with PCP and endo to assess for metabolic contributors. May consider Belsomra or switching to armodafinil pending sleep results. Healthy lifestyle habits encouraged. PMP aware reviewed and appropriate refills received in the past. Caution advised when driving. She will follow-up with me pending sleep study results.  She verbalizes understanding and agreement with this plan.   Orders Placed This Encounter  Procedures   Home sleep test    Standing Status:   Future    Standing Expiration Date:   02/29/2024    Order Specific Question:   Where should this test be performed:    Answer:   [facility]      No orders of the defined types were placed in this encounter.     I spent 30 minutes of face-to-face and non-face-to-face time with patient.  This included previsit chart review, lab review, study review, order entry, electronic health record documentation, patient education.   Shawnie Dapper, FNP-C 03/01/2023, 3:50 PM [facility] [address], [address] [place], [place] [address] [phone]

## 2023-03-01 NOTE — Patient Instructions (Addendum)
Below is our plan:  We will continue modafinil 200mg  daily. Please have endo check your thyroid asap. May consider additional labs like B12, vitamin D, CBC to check blood counts, etc. Talk with your PCP next week. We will order a sleep study to evaluate for sleep apnea. You will hear back from sleep lab to schedule. Please do not drive if you are sleepy. Roll window down while driving if needed. Pull over if you are feeling you may fall asleep.   Please make sure you are staying well hydrated. I recommend 50-60 ounces daily. Well balanced diet and regular exercise encouraged. Consistent sleep schedule with 6-8 hours recommended.   Please continue follow up with care team as directed.   Follow up with me sleep study.   You may receive a survey regarding today's visit. I encourage you to leave honest feed back as I do use this information to improve patient care. Thank you for seeing me today!

## 2023-03-15 ENCOUNTER — Ambulatory Visit: Payer: BC Managed Care – PPO | Admitting: Family Medicine

## 2023-03-22 ENCOUNTER — Ambulatory Visit: Payer: 59 | Admitting: Neurology

## 2023-03-22 DIAGNOSIS — R0683 Snoring: Secondary | ICD-10-CM

## 2023-03-22 DIAGNOSIS — G4719 Other hypersomnia: Secondary | ICD-10-CM

## 2023-03-22 DIAGNOSIS — G4733 Obstructive sleep apnea (adult) (pediatric): Secondary | ICD-10-CM

## 2023-03-22 DIAGNOSIS — G4710 Hypersomnia, unspecified: Secondary | ICD-10-CM

## 2023-03-22 DIAGNOSIS — G4739 Other sleep apnea: Secondary | ICD-10-CM

## 2023-03-22 DIAGNOSIS — G4711 Idiopathic hypersomnia with long sleep time: Secondary | ICD-10-CM

## 2023-03-31 NOTE — Progress Notes (Signed)
[facility]  Melissa House 51 year old female May 21, 1970  HOME SLEEP TEST REPORT ( by Watch PAT)   STUDY DATE:  03-31-2023 was data download day.   ORDERING CLINICIAN: amy lomax, NP REFERRING CLINICIAN: PCP   CLINICAL INFORMATION/HISTORY: 03-01-2023 03/01/23 ALL: Melissa House returns for follow up for insomnia and hypersomnia. She was last seen 03/2022 and doing fairly well on Lunesta 2mg  QHS and modafinil 200mg  QD. She reports that over the past year, she has felt Lunesta doesn't work as well keep her asleep. She goes to sleep without difficulty but wakes multiple times at night. She is much more sleepy during the day. She has gained some weight. About 25lbs. She reports her daughter has told her she is snoring more. Sleep study in 2017 showed AHI 3.7/h, supine AHI 16.2. She would like to repeat testing. She reports adjustments to levothyroxine were made in 10/2022 and she hasn't felt well since. She sleeps about 8 hours at night. She could take multiple naps during the day. Falls asleep easily if sitting still. She has gotten really sleepy when driving. She has not fallen asleep at wheel, recently.      Epworth sleepiness score: ESS: 22/24, previously 5/24    BMI: 35 kg/m   Neck Circumference: 14.5"   FINDINGS:   Sleep Summary:   Total Recording Time (hours, min): 8 hours 46 minutes      Total Sleep Time (hours, min):    8 hours 0 minutes             Percent REM (%):     About 19%                                   Respiratory Indices:   Calculated pAHI (per hour):     Following AASM criteria the AHI was 55.4/h                        REM pAHI:       62.1/h                                          NREM pAHI:    53.9/h                          Supine AHI:      The patient slept in normal supine position only.  There were 260 minutes of left lateral sleep associated with an AHI of 68.5/h and 119 minutes of right lateral sleep associated with an AHI of  40/h.  Snoring level reached a mean volume of 49 dB which is considered very high.  Snoring was present for well over half of the total recorded sleep time, 323 minutes.                                             Oxygen Saturation Statistics:   Oxygen Saturation (%) Mean:   92%             O2 Saturation Range (%):     Between the nadir at 74% of the maximum saturation of  98% with a mean desaturation nadir of 89%                                  O2 Saturation (minutes) <89%:   26.8 minutes O2 saturation (minutes) <90%:     41 minutes        Pulse Rate Statistics:   Pulse Mean (bpm):    67 bpm             Pulse Range:      Between 52 and 101 bpm           IMPRESSION:  This HST confirms the presence of severe complex sleep apnea, with a total AHI of 55.4/h and with about 20% of all events being of central origin.  The dominant form of sleep apnea is still obstructive.  This patient has significant oxygen desaturations by nadir at 74% as well as by total desaturation time.  This constellation can only be treated with positive airway pressure and is not effectively treated by a hypoglossal nerve stimulator or by a dental device.   RECOMMENDATION: I believe that the cause of excessive daytime sleepiness and snoring is clearly related to apnea and I want her to start positive airway pressure therapy ASAP.  She carries the primary diagnosis of idiopathic hypersomnia. This may no longer be the explanation for her hypersomnia.  The patient is following up with nurse practitioner Lomax between 30 to 90 days of CPAP therapy. Auto titration ResMed CPAP device with a setting between 6 and 18 cm water pressure, 2 cm EPR, heated humidification and interface of comfort and choice. As this patient is actively snoring it may be necessary to use a fullface mask which could be an under the nose model.  Weight loss is also strongly recommended especially for REM sleep accentuated forms of sleep apnea.      INTERPRETING PHYSICIAN:   Melvyn Novas, MD

## 2023-04-06 ENCOUNTER — Encounter: Payer: Self-pay | Admitting: Family Medicine

## 2023-04-12 ENCOUNTER — Other Ambulatory Visit: Payer: Self-pay

## 2023-04-12 DIAGNOSIS — G4711 Idiopathic hypersomnia with long sleep time: Secondary | ICD-10-CM

## 2023-04-12 DIAGNOSIS — G4719 Other hypersomnia: Secondary | ICD-10-CM

## 2023-04-12 MED ORDER — MODAFINIL 200 MG PO TABS: ORAL_TABLET | ORAL | 1 refills | Status: DC

## 2023-04-12 NOTE — Telephone Encounter (Signed)
Pt Last seen 03/01/2023 No Upcoming Appointment  Modafinil 200mg  last filled 10/06/2022 Escript 04/12/2023

## 2023-04-13 ENCOUNTER — Telehealth: Payer: Self-pay | Admitting: Family Medicine

## 2023-04-13 DIAGNOSIS — G4719 Other hypersomnia: Secondary | ICD-10-CM

## 2023-04-13 DIAGNOSIS — G4711 Idiopathic hypersomnia with long sleep time: Secondary | ICD-10-CM

## 2023-04-13 MED ORDER — MODAFINIL 200 MG PO TABS: ORAL_TABLET | ORAL | 1 refills | Status: DC

## 2023-04-13 NOTE — Telephone Encounter (Signed)
Called [facility] at [phone]. Spoke w/ Brayton Caves. Cx rx modafinil sent in on 04/12/23. She did not pick up yesterday.  Last seen 03/01/23 and next f/u 09/05/23. Last refilled 10/06/22 #90.

## 2023-04-13 NOTE — Telephone Encounter (Signed)
Pt called stating that she was needing her  modafinil (PROVIGIL) 200 MG tablet sent to the [facility] not the [facility] in [place].

## 2023-04-17 DIAGNOSIS — G4733 Obstructive sleep apnea (adult) (pediatric): Secondary | ICD-10-CM | POA: Insufficient documentation

## 2023-04-17 NOTE — Procedures (Signed)
[facility]  Melissa House 52 year old female May 21, 1970  HOME SLEEP TEST REPORT ( by Watch PAT)   STUDY DATE:  03-31-2023 was data download day.   ORDERING CLINICIAN: amy lomax, NP REFERRING CLINICIAN: PCP   CLINICAL INFORMATION/HISTORY: 03-01-2023 03/01/23 ALL: [name] returns for follow up for insomnia and hypersomnia. She was last seen 03/2022 and doing fairly well on Lunesta 2mg  QHS and modafinil 200mg  QD. She reports that over the past year, she has felt Lunesta doesn't work as well keep her asleep. She goes to sleep without difficulty but wakes multiple times at night. She is much more sleepy during the day. She has gained some weight. About 25lbs. She reports her daughter has told her she is snoring more. Sleep study in 2017 showed AHI 3.7/h, supine AHI 16.2. She would like to repeat testing. She reports adjustments to levothyroxine were made in 10/2022 and she hasn't felt well since. She sleeps about 8 hours at night. She could take multiple naps during the day. Falls asleep easily if sitting still. She has gotten really sleepy when driving. She has not fallen asleep at wheel, recently.      Epworth sleepiness score: ESS: 22/24, previously 5/24    BMI: 35 kg/m   Neck Circumference: 14.5"   FINDINGS:   Sleep Summary:   Total Recording Time (hours, min): 8 hours 46 minutes      Total Sleep Time (hours, min):    8 hours 0 minutes             Percent REM (%):     About 19%                                   Respiratory Indices:   Calculated pAHI (per hour):     Following AASM criteria the AHI was 55.4/h                        REM pAHI:       62.1/h                                          NREM pAHI:    53.9/h                          Supine AHI:      The patient slept in normal supine position only.  There were 260 minutes of left lateral sleep associated with an AHI of 68.5/h and 119 minutes of right lateral sleep associated with an AHI of  40/h.  Snoring level reached a mean volume of 49 dB which is considered very high.  Snoring was present for well over half of the total recorded sleep time, 323 minutes.                                             Oxygen Saturation Statistics:   Oxygen Saturation (%) Mean:   92%             O2 Saturation Range (%):     Between the nadir at 74% of the maximum saturation of  98% with a mean desaturation nadir of 89%                                  O2 Saturation (minutes) <89%:   26.8 minutes O2 saturation (minutes) <90%:     41 minutes        Pulse Rate Statistics:   Pulse Mean (bpm):    67 bpm             Pulse Range:      Between 52 and 101 bpm           IMPRESSION:  This HST confirms the presence of severe complex sleep apnea, with a total AHI of 55.4/h and with about 20% of all events being of central origin.  The dominant form of sleep apnea is still obstructive.  This patient has significant oxygen desaturations by nadir at 74% as well as by total desaturation time.  This constellation can only be treated with positive airway pressure and is not effectively treated by a hypoglossal nerve stimulator or by a dental device.   RECOMMENDATION: I believe that the cause of excessive daytime sleepiness and snoring is clearly related to apnea and I want her to start positive airway pressure therapy ASAP.  She carries the primary diagnosis of idiopathic hypersomnia. This may no longer be the explanation for her hypersomnia.  The patient is following up with nurse practitioner Lomax between 30 to 90 days of CPAP therapy. Auto titration ResMed CPAP device with a setting between 6 and 18 cm water pressure, 2 cm EPR, heated humidification and interface of comfort and choice. As this patient is actively snoring it may be necessary to use a fullface mask which could be an under the nose model.  Weight loss is also strongly recommended especially for REM sleep accentuated forms of sleep apnea.      INTERPRETING PHYSICIAN:   Melvyn Novas, MD

## 2023-04-18 ENCOUNTER — Telehealth: Payer: Self-pay | Admitting: Family Medicine

## 2023-04-18 ENCOUNTER — Encounter: Payer: Self-pay | Admitting: Family Medicine

## 2023-04-18 NOTE — Telephone Encounter (Signed)
Pt calling with questions about her sleep results and what would be her next steps to get her cpap machine. Requesting callback.

## 2023-04-18 NOTE — Telephone Encounter (Signed)
Melissa House- pt asking for results to sleep study

## 2023-04-19 ENCOUNTER — Telehealth: Payer: Self-pay

## 2023-04-19 NOTE — Telephone Encounter (Signed)
Called pt and scheduled her for her Initial CPAP F/U on 06/21/2023.

## 2023-05-23 ENCOUNTER — Telehealth: Payer: Self-pay | Admitting: Family Medicine

## 2023-05-23 NOTE — Telephone Encounter (Signed)
 Appointment needed to be r/s, pt reminded to bring CPAP and power cord.

## 2023-06-16 ENCOUNTER — Other Ambulatory Visit: Payer: Self-pay | Admitting: Orthopedic Surgery

## 2023-06-20 ENCOUNTER — Ambulatory Visit: Payer: 59 | Admitting: Family Medicine

## 2023-06-23 NOTE — Progress Notes (Signed)
Melissa House

## 2023-06-27 ENCOUNTER — Ambulatory Visit (INDEPENDENT_AMBULATORY_CARE_PROVIDER_SITE_OTHER): Payer: No Typology Code available for payment source | Admitting: Neurology

## 2023-06-27 ENCOUNTER — Encounter: Payer: Self-pay | Admitting: Neurology

## 2023-06-27 VITALS — BP 114/69 | HR 77 | Ht 65.0 in | Wt 199.8 lb

## 2023-06-27 DIAGNOSIS — G4710 Hypersomnia, unspecified: Secondary | ICD-10-CM

## 2023-06-27 DIAGNOSIS — G4719 Other hypersomnia: Secondary | ICD-10-CM

## 2023-06-27 DIAGNOSIS — G4730 Sleep apnea, unspecified: Secondary | ICD-10-CM

## 2023-06-27 DIAGNOSIS — G4733 Obstructive sleep apnea (adult) (pediatric): Secondary | ICD-10-CM | POA: Diagnosis not present

## 2023-06-27 DIAGNOSIS — G4711 Idiopathic hypersomnia with long sleep time: Secondary | ICD-10-CM | POA: Diagnosis not present

## 2023-06-27 MED ORDER — MODAFINIL 200 MG PO TABS: ORAL_TABLET | ORAL | 1 refills | Status: DC

## 2023-06-27 NOTE — Progress Notes (Signed)
Provider:  Melvyn Novas, MD  Primary Care Physician:  Deboraha Sprang at [place].  [place]   , [place]   Merlene Laughter, MD (Inactive) [address] [place] [address]     Referring Provider:         Chief Complaint according to patient   Patient presents with:                HISTORY OF PRESENT ILLNESS:  Melissa House is a 53 y.o. female patient who is here for revisit 06/27/2023 for  first visit on CPAP with hypersomnia, on modafinil-  no more than 6 hours of effect at this time but sleep has improved at night, no need for Lunesta.  Chief concern according to patient :  New on CPAP : Melissa House has been a longstanding patient in our practice, she has had underlying sleepiness issues and in the past tested negative for sleep apnea.  It was in November 2024 when she was evaluated with a new home sleep test device that her apnea was discovered and described as severe obstructive sleep apnea.  An auto titration CPAP device was ordered which she has used compliantly and she has noted that her sleep is less fragmented and more sustained she was even able to discontinue the use of Lunesta.  Some hypersomnia still remains and she endorsed today the Epworth sleepiness score at 12 points.  Prior to the sleep study the score was 22 points.  She has taken modafinil for years but reports that within a 6-hour.  The effect of the medication weans off and we are discussing today to add a lunchtime dose for her that would be half of the morning dose.  I will refill her 90-day prescription at 135 tablets and she will split a full-size modafinil in half to be used at lunchtime. She is only using caffeine as 1 cup of coffee in AM.     03/01/23 ALL: Melissa House returns for follow up for insomnia and hypersomnia. She was last seen 03/2022 and doing fairly well on Lunesta 2mg  QHS and modafinil 200mg  QD. She reports that over the past year, she has felt Lunesta doesn't work as well  keep her asleep. She goes to sleep without difficulty but wakes multiple times at night. She is much more sleepy during the day. She has gained some weight. About 25lbs. She reports her daughter has told her she is snoring more. Sleep study in 2017 showed AHI 3.7/h, supine AHI 16.2. She would like to repeat testing. She reports adjustments to levothyroxine were made in 10/2022 and she hasn't felt well since. She sleeps about 8 hours at night. She could take multiple naps during the day. Falls asleep easily if sitting still. She has gotten really sleepy when driving. She has not fallen asleep at wheel, recently.   Calculated pAHI (per hour):     Following AASM criteria the AHI was 55.4/h                        REM pAHI:       62.1/h                                          NREM pAHI:    53.9/h    This HST confirms the presence  of severe complex sleep apnea, with a total AHI of 55.4/h and with about 20% of all events being of central origin.  The dominant form of sleep apnea is still obstructive.  This patient has significant oxygen desaturations by nadir at 74% as well as by total desaturation time.  This constellation can only be treated with positive airway pressure and is not effectively treated by a hypoglossal nerve stimulator or by a dental device.   RECOMMENDATION: I believe that the cause of excessive daytime sleepiness and snoring is clearly related to apnea and I want her to start positive airway pressure therapy ASAP.  She carries the primary diagnosis of idiopathic hypersomnia. This may no longer be the explanation for her hypersomnia.   The patient is following up with nurse practitioner Lomax between 30 to 90 days of CPAP therapy. Auto titration ResMed CPAP device with a setting between 6 and 18 cm water pressure, 2 cm EPR, heated humidification and interface of comfort and choice. As this patient is actively snoring it may be necessary to use a fullface mask which could be an under the nose  model.   Weight loss is also strongly recommended especially for REM sleep accentuated forms of sleep apnea.             Epworth sleepiness score: ESS: 22/24, previously 5/24    BMI: 35 kg/m   Neck Circumference: 14.5"      Review of Systems: Out of a complete 14 system review, the patient complains of only the following symptoms, and all other reviewed systems are negative.:  Fatigue, sleepiness , snoring, fragmented sleep, I  How likely are you to doze in the following situations: 0 = not likely, 1 = slight chance, 2 = moderate chance, 3 = high chance   Sitting and Reading? Watching Television? Sitting inactive in a public place (theater or meeting)? As a passenger in a car for an hour without a break? Lying down in the afternoon when circumstances permit? Sitting and talking to someone? Sitting quietly after lunch without alcohol? In a car, while stopped for a few minutes in traffic?   Total = 12/ 24 points.   FSS endorsed at 28/ 63 points.   Social History   Socioeconomic History   Marital status: Married    Spouse name: Not on file   Number of children: 2   Years of education: 16   Highest education level: Not on file  Occupational History   Occupation: Magazine features editor: Nurse, mental health [employer]  Tobacco Use   Smoking status: Never   Smokeless tobacco: Never  Vaping Use   Vaping status: Never Used  Substance and Sexual Activity   Alcohol use: Yes    Comment: occasional   Drug use: No   Sexual activity: Not on file  Other Topics Concern   Not on file  Social History Narrative   Lives at home with husband, children    Caffeine use- 1-2 a week   Social Drivers of Corporate investment banker Strain: Not on file  Food Insecurity: Low Risk  (06/07/2023)   Received from [facility]   Hunger Vital Sign    Worried About Running Out of Food in the Last Year: Never true    Ran Out of Food in the Last Year: Never true  Transportation Needs: No  Transportation Needs (06/07/2023)   Received from [facility]    In the past 12 months, has lack  of reliable transportation kept you from medical appointments, meetings, work or from getting things needed for daily living? : No  Physical Activity: Not on file  Stress: Not on file  Social Connections: Not on file    Family History  Problem Relation Age of Onset   Hypertension Mother    Hypercholesterolemia Mother    Hypertension Father    Lymphoma Father        NHL   Colon cancer Neg Hx    Esophageal cancer Neg Hx    Rectal cancer Neg Hx    Stomach cancer Neg Hx    Colon polyps Neg Hx     Past Medical History:  Diagnosis Date   Asthma    Depression    Ectopic pregnancy 1999   Hypothyroidism    Seasonal allergies     Past Surgical History:  Procedure Laterality Date   BREAST BIOPSY Right 10/31/2020   ECTOPIC PREGNANCY SURGERY     SHOULDER SURGERY  1992   Left   UPPER GASTROINTESTINAL ENDOSCOPY  06/14/2011     Current Outpatient Medications on File Prior to Visit  Medication Sig Dispense Refill   escitalopram (LEXAPRO) 20 MG tablet Take 20 mg by mouth daily. Takes 40 mg daily     estradiol-norethindrone (ACTIVELLA) 1-0.5 MG tablet Take 1 tablet by mouth daily.     levothyroxine (SYNTHROID, LEVOTHROID) 125 MCG tablet Take 125 mcg by mouth daily.     modafinil (PROVIGIL) 200 MG tablet Take 1 tablet (200mg ) every morning. May take 1/2 tablet (100mg ) at lunch if needed. 90 tablet 1   NIFEdipine (ADALAT CC) 30 MG 24 hr tablet Take 30 mg by mouth daily.     No current facility-administered medications on file prior to visit.    No Known Allergies   DIAGNOSTIC DATA (LABS, IMAGING, TESTING) - I reviewed patient records, labs, notes, testing and imaging myself where available.  No results found for: "WBC", "HGB", "HCT", "MCV", "PLT" No results found for: "NA", "K", "CL", "CO2", "GLUCOSE", "BUN", "CREATININE", "CALCIUM", "PROT", "ALBUMIN", "AST", "ALT",  "ALKPHOS", "BILITOT", "GFRNONAA", "GFRAA" No results found for: "CHOL", "HDL", "LDLCALC", "LDLDIRECT", "TRIG", "CHOLHDL" No results found for: "HGBA1C" No results found for: "VITAMINB12" No results found for: "TSH"  PHYSICAL EXAM:  Today's Vitals   06/27/23 1310  BP: 114/69  Pulse: 77  Weight: 199 lb 12.8 oz (90.6 kg)  Height: 5\' 5"  (1.651 m)   Body mass index is 33.25 kg/m.   Wt Readings from Last 3 Encounters:  06/27/23 199 lb 12.8 oz (90.6 kg)  03/01/23 211 lb (95.7 kg)  03/11/22 197 lb (89.4 kg)     Ht Readings from Last 3 Encounters:  06/27/23 5\' 5"  (1.651 m)  03/01/23 5\' 5"  (1.651 m)  03/11/22 5\' 5"  (1.651 m)      General: The patient is awake, alert and appears not in acute distress. The patient is well groomed. Head: Normocephalic, atraumatic. Neck is supple.  Mallampati 2,  neck circumference:15 inches . Nasal airflow not fully patent.   Retrognathia is not seen.  Dental status: biological  Cardiovascular:  Regular rate and cardiac rhythm by pulse,  without distended neck veins. Respiratory: Lungs are clear to auscultation.  Skin:  Without evidence of ankle edema, or rash. Trunk: The patient's posture is erect.   NEUROLOGIC EXAM: TAlert oriented to time, place, history taking. Cranial nerve :  Taste and smell are intact. Pupils were equal , are round and  reactive to light- visual field were full  on confrontational test.  Facial sensation and strength were normal.  Uvula and tongue midline. Hearing is preserved. Head turning and shoulder shrug  were normal and symmetric. Motor: 5 / 5 strength , symmetric motor tone is noted throughout, no grip strength loss Sensory: Sensory testing is intact to soft touch on all 4 extremities.  Coordination:   No handwriting changes.  Gait and station: Gait is normal.   DTR intact.  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   Epworth score reduced from 22 to 12 on CPAP.   2) High CPAP compliance. 95%pressure was 13 cm water   3)  plan : split Modafinil for after noon dosing. 1 tab in AM and 1/2 tab at lunch time.     I plan to follow up through our NP within 12 months.   I would like to thank Merlene Laughter, MD (Inactive) and Eliane Decree, PA  for allowing me to meet with and to take care of this pleasant patient.   CC: I will share my notes with PCP [facility] at [facility]. .  After spending a total time of  21  minutes face to face and additional time for physical and neurologic examination, review of laboratory studies,  personal review of imaging studies, reports and results of other testing and review of referral information / records as far as provided in visit,   Electronically signed by: Melvyn Novas, MD 06/27/2023 1:38 PM  [facility] and [facility] Board certified by The [employer] of Sleep Medicine and Diplomate of the [employer] of Sleep Medicine. Board certified In Neurology through the ABPN, Fellow of the [employer] of Neurology.

## 2023-06-27 NOTE — Patient Instructions (Signed)
 Modafinil Tablets What is this medication? MODAFINIL (moe DAF i nil) treats sleep disorders, such as narcolepsy, obstructive sleep apnea, and shift work disorder. It works by promoting wakefulness. It belongs to a group of medications called stimulants. This medicine may be used for other purposes; ask your health care provider or pharmacist if you have questions. COMMON BRAND NAME(S): Provigil What should I tell my care team before I take this medication? They need to know if you have any of these conditions: Kidney disease Liver disease Mental health conditions An unusual or allergic reaction to modafinil, other medications, foods, dyes, or preservatives Pregnant or trying to get pregnant Breast-feeding How should I use this medication? Take this medication by mouth with water. Take it as directed on the prescription label at the same time every day. You can take it with or without food. If it upsets your stomach, take it with food. Keep taking it unless your care team tells you to stop. A special MedGuide will be given to you by the pharmacist with each prescription and refill. Be sure to read this information carefully each time. Talk to your care team about the use of this medication in children. Special care may be needed. Overdosage: If you think you have taken too much of this medicine contact a poison control center or emergency room at once. NOTE: This medicine is only for you. Do not share this medicine with others. What if I miss a dose? If you miss a dose, take it as soon as you can. If it is almost time for your next dose, take only that dose. Do not take double or extra doses. What may interact with this medication? Do not take this medication with any of the following: Amphetamine or dextroamphetamine Dexmethylphenidate or methylphenidate MAOIs, such as Marplan, Nardil, and Parnate Pemoline Procarbazine This medication may also interact with the following: Antifungal  medications, such as itraconazole or ketoconazole Barbiturates, such as phenobarbital Carbamazepine Cyclosporine Diazepam Estrogen or progestin hormones Medications for mental health conditions Phenytoin Propranolol Triazolam Warfarin This list may not describe all possible interactions. Give your health care provider a list of all the medicines, herbs, non-prescription drugs, or dietary supplements you use. Also tell them if you smoke, drink alcohol, or use illegal drugs. Some items may interact with your medicine. What should I watch for while using this medication? Visit your care team for regular checks on your progress. It may be some time before you see the benefit from this medication. This medication may affect your coordination, reaction time, or judgment. Do not drive or operate machinery until you know how this medication affects you. Sit up or stand slowly to reduce the risk of dizzy or fainting spells. Drinking alcohol with this medication can increase the risk of these side effects. This medication may cause serious skin reactions. They can happen weeks to months after starting the medication. Contact your care team right away if you notice fevers or flu-like symptoms with a rash. The rash may be red or purple and then turn into blisters or peeling of the skin. You may also notice a red rash with swelling of the face, lips, or lymph nodes in your neck or under your arms. Estrogen and progestin hormones may not work as well while you are taking this medication. Your care team can help you find the contraceptive option that works for you. It is unknown if the effects of this medication will be increased by the use of caffeine. Caffeine is  found in many foods, beverages, and medications. Ask your care team if you should limit or change your intake of caffeine-containing products while on this medication. What side effects may I notice from receiving this medication? Side effects that  you should report to your care team as soon as possible: Allergic reactions or angioedema--skin rash, itching or hives, swelling of the face, eyes, lips, tongue, arms, or legs, trouble swallowing or breathing Increase in blood pressure Mood and behavior changes--anxiety, nervousness, confusion, hallucinations, irritability, hostility, thoughts of suicide or self-harm, worsening mood, feelings of depression Rash, fever, and swollen lymph nodes Redness, blistering, peeling, or loosening of the skin, including inside the mouth Side effects that usually do not require medical attention (report to your care team if they continue or are bothersome): Anxiety, nervousness Dizziness Headache Nausea Trouble sleeping This list may not describe all possible side effects. Call your doctor for medical advice about side effects. You may report side effects to FDA at 1-800-FDA-1088. Where should I keep my medication? Keep out of the reach of children and pets. This medication can be abused. Keep it in a safe place to protect it from theft. Do not share it with anyone. It is only for you. Selling or giving away this medication is dangerous and against the law. Store at room temperature between 20 and 25 degrees C (68 and 77 degrees F). Get rid of any unused medication after the expiration date. This medication may cause harm and death if it is taken by other adults, children, or pets. It is important to get rid of the medication as soon as you no longer need it or it is expired. You can do this in two ways: Take the medication to a medication take-back program. Check with your pharmacy or law enforcement to find a location. If you cannot return the medication, check the label or package insert to see if the medication should be thrown out in the garbage or flushed down the toilet. If you are not sure, ask your care team. If it is safe to put it in the trash, take the medication out of the container. Mix the  medication with cat litter, dirt, coffee grounds, or other unwanted substance. Seal the mixture in a bag or container. Put it in the trash. NOTE: This sheet is a summary. It may not cover all possible information. If you have questions about this medicine, talk to your doctor, pharmacist, or health care provider.  2024 Elsevier/Gold Standard (2022-06-04 00:00:00)

## 2023-07-04 ENCOUNTER — Other Ambulatory Visit: Payer: Self-pay | Admitting: Obstetrics and Gynecology

## 2023-07-04 DIAGNOSIS — R928 Other abnormal and inconclusive findings on diagnostic imaging of breast: Secondary | ICD-10-CM

## 2023-07-13 ENCOUNTER — Ambulatory Visit: Payer: Commercial Managed Care - PPO

## 2023-07-13 ENCOUNTER — Ambulatory Visit
Admission: RE | Admit: 2023-07-13 | Discharge: 2023-07-13 | Disposition: A | Discharge status: Home / Self Care | Payer: Commercial Managed Care - PPO | Source: Ambulatory Visit | Attending: Obstetrics and Gynecology | Admitting: Obstetrics and Gynecology

## 2023-07-13 DIAGNOSIS — R928 Other abnormal and inconclusive findings on diagnostic imaging of breast: Secondary | ICD-10-CM

## 2023-07-15 ENCOUNTER — Encounter (HOSPITAL_BASED_OUTPATIENT_CLINIC_OR_DEPARTMENT_OTHER): Payer: Self-pay | Admitting: *Deleted

## 2023-07-15 ENCOUNTER — Encounter (HOSPITAL_BASED_OUTPATIENT_CLINIC_OR_DEPARTMENT_OTHER)
Admission: RE | Admit: 2023-07-15 | Discharge: 2023-07-15 | Disposition: A | Discharge status: Home / Self Care | Source: Ambulatory Visit | Attending: Orthopedic Surgery | Admitting: Orthopedic Surgery

## 2023-07-15 ENCOUNTER — Other Ambulatory Visit: Payer: Self-pay

## 2023-07-15 NOTE — Progress Notes (Signed)
 Pre surgical drink given to patient, instructions given, patient verbalized understanding. EKG completed.

## 2023-07-22 ENCOUNTER — Encounter (HOSPITAL_BASED_OUTPATIENT_CLINIC_OR_DEPARTMENT_OTHER): Payer: Self-pay | Admitting: Orthopedic Surgery

## 2023-07-22 ENCOUNTER — Ambulatory Visit (HOSPITAL_BASED_OUTPATIENT_CLINIC_OR_DEPARTMENT_OTHER): Admitting: Anesthesiology

## 2023-07-22 ENCOUNTER — Ambulatory Visit (HOSPITAL_BASED_OUTPATIENT_CLINIC_OR_DEPARTMENT_OTHER)
Admission: RE | Admit: 2023-07-22 | Discharge: 2023-07-22 | Disposition: A | Discharge status: Home / Self Care | Payer: Commercial Managed Care - PPO | Attending: Orthopedic Surgery | Admitting: Orthopedic Surgery

## 2023-07-22 ENCOUNTER — Other Ambulatory Visit: Payer: Self-pay

## 2023-07-22 ENCOUNTER — Encounter (HOSPITAL_BASED_OUTPATIENT_CLINIC_OR_DEPARTMENT_OTHER)
Admission: RE | Disposition: A | Discharge status: Home / Self Care | Payer: Self-pay | Source: Home / Self Care | Attending: Orthopedic Surgery

## 2023-07-22 DIAGNOSIS — Z79890 Hormone replacement therapy (postmenopausal): Secondary | ICD-10-CM | POA: Diagnosis not present

## 2023-07-22 DIAGNOSIS — Z01818 Encounter for other preprocedural examination: Secondary | ICD-10-CM

## 2023-07-22 DIAGNOSIS — K219 Gastro-esophageal reflux disease without esophagitis: Secondary | ICD-10-CM | POA: Diagnosis not present

## 2023-07-22 DIAGNOSIS — I10 Essential (primary) hypertension: Secondary | ICD-10-CM | POA: Diagnosis not present

## 2023-07-22 DIAGNOSIS — E039 Hypothyroidism, unspecified: Secondary | ICD-10-CM | POA: Diagnosis not present

## 2023-07-22 DIAGNOSIS — Z79899 Other long term (current) drug therapy: Secondary | ICD-10-CM | POA: Diagnosis not present

## 2023-07-22 DIAGNOSIS — M65312 Trigger thumb, left thumb: Secondary | ICD-10-CM | POA: Diagnosis present

## 2023-07-22 DIAGNOSIS — J45909 Unspecified asthma, uncomplicated: Secondary | ICD-10-CM | POA: Insufficient documentation

## 2023-07-22 DIAGNOSIS — F32A Depression, unspecified: Secondary | ICD-10-CM | POA: Diagnosis not present

## 2023-07-22 HISTORY — PX: TRIGGER FINGER RELEASE: SHX641

## 2023-07-22 HISTORY — DX: Essential (primary) hypertension: I10

## 2023-07-22 HISTORY — DX: Nausea with vomiting, unspecified: R11.2

## 2023-07-22 HISTORY — DX: Other specified postprocedural states: Z98.890

## 2023-07-22 SURGERY — RELEASE, A1 PULLEY, FOR TRIGGER FINGER
Anesthesia: General | Site: Thumb | Laterality: Left

## 2023-07-22 MED ORDER — ACETAMINOPHEN 500 MG PO TABS
ORAL_TABLET | ORAL | Status: AC
  Filled 2023-07-22: qty 2

## 2023-07-22 MED ORDER — BUPIVACAINE HCL (PF) 0.25 % IJ SOLN
INTRAMUSCULAR | Status: AC
  Filled 2023-07-22: qty 90

## 2023-07-22 MED ORDER — ONDANSETRON HCL 4 MG/2ML IJ SOLN
INTRAMUSCULAR | Status: AC
  Filled 2023-07-22: qty 2

## 2023-07-22 MED ORDER — LACTATED RINGERS IV SOLN: INTRAVENOUS | Status: DC

## 2023-07-22 MED ORDER — STERILE WATER FOR IRRIGATION IR SOLN
Status: DC | PRN
  Administered 2023-07-22: 1000 mL

## 2023-07-22 MED ORDER — CEFAZOLIN SODIUM-DEXTROSE 2-4 GM/100ML-% IV SOLN
2.0000 g | INTRAVENOUS | Status: AC
Start: 2023-07-22 — End: 2023-07-22
  Administered 2023-07-22: 2 g via INTRAVENOUS

## 2023-07-22 MED ORDER — BUPIVACAINE HCL (PF) 0.25 % IJ SOLN
INTRAMUSCULAR | Status: DC | PRN
  Administered 2023-07-22: 9 mL

## 2023-07-22 MED ORDER — DEXAMETHASONE SODIUM PHOSPHATE 10 MG/ML IJ SOLN
INTRAMUSCULAR | Status: DC | PRN
  Administered 2023-07-22: 10 mg via INTRAVENOUS

## 2023-07-22 MED ORDER — PROPOFOL 10 MG/ML IV BOLUS
INTRAVENOUS | Status: AC
  Filled 2023-07-22: qty 20

## 2023-07-22 MED ORDER — LIDOCAINE 2% (20 MG/ML) 5 ML SYRINGE
INTRAMUSCULAR | Status: AC
  Filled 2023-07-22: qty 5

## 2023-07-22 MED ORDER — LIDOCAINE 2% (20 MG/ML) 5 ML SYRINGE
INTRAMUSCULAR | Status: DC | PRN
  Administered 2023-07-22: 60 mg via INTRAVENOUS

## 2023-07-22 MED ORDER — CEFAZOLIN SODIUM-DEXTROSE 2-4 GM/100ML-% IV SOLN
INTRAVENOUS | Status: AC
  Filled 2023-07-22: qty 100

## 2023-07-22 MED ORDER — MIDAZOLAM HCL 2 MG/2ML IJ SOLN
INTRAMUSCULAR | Status: AC
  Filled 2023-07-22: qty 2

## 2023-07-22 MED ORDER — MIDAZOLAM HCL 5 MG/5ML IJ SOLN
INTRAMUSCULAR | Status: DC | PRN
  Administered 2023-07-22: 2 mg via INTRAVENOUS

## 2023-07-22 MED ORDER — FENTANYL CITRATE (PF) 100 MCG/2ML IJ SOLN
INTRAMUSCULAR | Status: DC | PRN
  Administered 2023-07-22: 50 ug via INTRAVENOUS
  Administered 2023-07-22: 25 ug via INTRAVENOUS

## 2023-07-22 MED ORDER — DEXAMETHASONE SODIUM PHOSPHATE 10 MG/ML IJ SOLN
INTRAMUSCULAR | Status: AC
  Filled 2023-07-22: qty 1

## 2023-07-22 MED ORDER — HYDROCODONE-ACETAMINOPHEN 5-325 MG PO TABS: 1.0000 | ORAL_TABLET | Freq: Four times a day (QID) | ORAL | 0 refills | Status: AC | PRN

## 2023-07-22 MED ORDER — FENTANYL CITRATE (PF) 100 MCG/2ML IJ SOLN
INTRAMUSCULAR | Status: AC
  Filled 2023-07-22: qty 2

## 2023-07-22 MED ORDER — PROPOFOL 10 MG/ML IV BOLUS
INTRAVENOUS | Status: DC | PRN
  Administered 2023-07-22: 150 mg via INTRAVENOUS

## 2023-07-22 MED ORDER — ACETAMINOPHEN 500 MG PO TABS
1000.0000 mg | ORAL_TABLET | Freq: Once | ORAL | Status: AC
  Administered 2023-07-22: 1000 mg via ORAL

## 2023-07-22 MED ORDER — ONDANSETRON HCL 4 MG/2ML IJ SOLN
INTRAMUSCULAR | Status: DC | PRN
  Administered 2023-07-22: 4 mg via INTRAVENOUS

## 2023-07-22 SURGICAL SUPPLY — 27 items
BLADE SURG 15 STRL LF DISP TIS (BLADE) ×2 IMPLANT
BNDG COHESIVE 2X5 TAN ST LF (GAUZE/BANDAGES/DRESSINGS) ×1 IMPLANT
BNDG ESMARK 4X9 LF (GAUZE/BANDAGES/DRESSINGS) IMPLANT
CHLORAPREP W/TINT 26 (MISCELLANEOUS) ×1 IMPLANT
CORD BIPOLAR FORCEPS 12FT (ELECTRODE) ×1 IMPLANT
COVER BACK TABLE 60X90IN (DRAPES) ×1 IMPLANT
COVER MAYO STAND STRL (DRAPES) ×1 IMPLANT
CUFF TOURN SGL QUICK 18X4 (TOURNIQUET CUFF) ×1 IMPLANT
DRAPE EXTREMITY T 121X128X90 (DISPOSABLE) ×1 IMPLANT
DRAPE SURG 17X23 STRL (DRAPES) ×1 IMPLANT
GAUZE SPONGE 4X4 12PLY STRL (GAUZE/BANDAGES/DRESSINGS) ×1 IMPLANT
GAUZE XEROFORM 1X8 LF (GAUZE/BANDAGES/DRESSINGS) ×1 IMPLANT
GLOVE BIO SURGEON STRL SZ7.5 (GLOVE) ×1 IMPLANT
GLOVE BIOGEL PI IND STRL 8 (GLOVE) ×1 IMPLANT
GLOVE SURG SS PI 7.0 STRL IVOR (GLOVE) IMPLANT
GOWN STRL REUS W/ TWL LRG LVL3 (GOWN DISPOSABLE) ×1 IMPLANT
GOWN STRL REUS W/TWL XL LVL3 (GOWN DISPOSABLE) ×1 IMPLANT
NDL HYPO 25X1 1.5 SAFETY (NEEDLE) ×1 IMPLANT
NEEDLE HYPO 25X1 1.5 SAFETY (NEEDLE) ×1 IMPLANT
NS IRRIG 1000ML POUR BTL (IV SOLUTION) ×1 IMPLANT
PACK BASIN DAY SURGERY FS (CUSTOM PROCEDURE TRAY) ×1 IMPLANT
STOCKINETTE 4X48 STRL (DRAPES) ×1 IMPLANT
SUT ETHILON 4 0 PS 2 18 (SUTURE) ×1 IMPLANT
SYR BULB EAR ULCER 3OZ GRN STR (SYRINGE) ×1 IMPLANT
SYR CONTROL 10ML LL (SYRINGE) ×1 IMPLANT
TOWEL GREEN STERILE FF (TOWEL DISPOSABLE) ×2 IMPLANT
UNDERPAD 30X36 HEAVY ABSORB (UNDERPADS AND DIAPERS) ×1 IMPLANT

## 2023-07-22 NOTE — Discharge Instructions (Addendum)
[facility] Instructions Hand Surgery  Wound Care: Keep your hand elevated above the level of your heart.  Do not allow it to dangle by your side.  Keep the dressing dry and do not remove it unless your doctor advises you to do so.  He will usually change it at the time of your post-op visit.  Moving your fingers is advised to stimulate circulation but will depend on the site of your surgery.  If you have a splint applied, your doctor will advise you regarding movement.  Activity: Do not drive or operate machinery today.  Rest today and then you may return to your normal activity and work as indicated by your physician.  Diet:  Drink liquids today or eat a light diet.  You may resume a regular diet tomorrow.    General expectations: Pain for two to three days. Fingers may become slightly swollen.  Call your doctor if any of the following occur: Severe pain not relieved by pain medication. Elevated temperature. Dressing soaked with blood. Inability to move fingers. White or bluish color to fingers.   No Tylenol until 6pm  Post Anesthesia Home Care Instructions  Activity: Get plenty of rest for the remainder of the day. A responsible individual must stay with you for 24 hours following the procedure.  For the next 24 hours, DO NOT: -Drive a car -Paediatric nurse -Drink alcoholic beverages -Take any medication unless instructed by your physician -Make any legal decisions or sign important papers.  Meals: Start with liquid foods such as gelatin or soup. Progress to regular foods as tolerated. Avoid greasy, spicy, heavy foods. If nausea and/or vomiting occur, drink only clear liquids until the nausea and/or vomiting subsides. Call your physician if vomiting continues.  Special Instructions/Symptoms: Your throat may feel dry or sore from the anesthesia or the breathing tube placed in your throat during surgery. If this causes discomfort, gargle with warm salt water. The discomfort  should disappear within 24 hours.  If you had a scopolamine patch placed behind your ear for the management of post- operative nausea and/or vomiting:  1. The medication in the patch is effective for 72 hours, after which it should be removed.  Wrap patch in a tissue and discard in the trash. Wash hands thoroughly with soap and water. 2. You may remove the patch earlier than 72 hours if you experience unpleasant side effects which may include dry mouth, dizziness or visual disturbances. 3. Avoid touching the patch. Wash your hands with soap and water after contact with the patch.

## 2023-07-22 NOTE — H&P (Signed)
 Melissa House is an 53 y.o. female.   Chief Complaint: trigger thumb HPI: 53 yo female with triggering of left thumb.  This has been injected without lasting resolution.  She wishes to have surgical trigger release left thumb.   Allergies: No Known Allergies  Past Medical History:  Diagnosis Date   Asthma    Depression    Ectopic pregnancy 1999   Hypertension    Hypothyroidism    PONV (postoperative nausea and vomiting)    Seasonal allergies     Past Surgical History:  Procedure Laterality Date   BREAST BIOPSY Right 10/31/2020   ECTOPIC PREGNANCY SURGERY     SHOULDER SURGERY  1992   Left   UPPER GASTROINTESTINAL ENDOSCOPY  06/14/2011    Family History: Family History  Problem Relation Age of Onset   Hypertension Mother    Hypercholesterolemia Mother    Hypertension Father    Lymphoma Father        NHL   Colon cancer Neg Hx    Esophageal cancer Neg Hx    Rectal cancer Neg Hx    Stomach cancer Neg Hx    Colon polyps Neg Hx     Social History:   reports that she has never smoked. She has never used smokeless tobacco. She reports current alcohol use. She reports that she does not use drugs.  Medications: Medications Prior to Admission  Medication Sig Dispense Refill   CALCIUM PO Take by mouth.     escitalopram (LEXAPRO) 20 MG tablet Take 20 mg by mouth daily. Takes 40 mg daily     estradiol-norethindrone (ACTIVELLA) 1-0.5 MG tablet Take 1 tablet by mouth daily.     levothyroxine (SYNTHROID, LEVOTHROID) 125 MCG tablet Take 125 mcg by mouth daily.     MAGNESIUM PO Take by mouth.     modafinil (PROVIGIL) 200 MG tablet Take 1 tablet (200mg ) every morning. May take 1/2 tablet (100mg ) at lunch if needed. 135 tablet 1   Multiple Vitamin (MULTIVITAMIN WITH MINERALS) TABS tablet Take 1 tablet by mouth daily.     NIFEdipine (ADALAT CC) 30 MG 24 hr tablet Take 30 mg by mouth daily.      No results found for this or any previous visit (from the past 48 hours).  No results  found.    Blood pressure 121/62, pulse (!) 18, temperature 97.7 F (36.5 C), temperature source Oral, resp. rate 18, height 5\' 5"  (1.651 m), weight 89.3 kg, SpO2 98%.  General appearance: alert, cooperative, and appears stated age Head: Normocephalic, without obvious abnormality, atraumatic Neck: supple, symmetrical, trachea midline Extremities: Intact sensation and capillary refill all digits.  +epl/fpl/io.  No wounds.  Skin: Skin color, texture, turgor normal. No rashes or lesions Neurologic: Grossly normal Incision/Wound: none  Assessment/Plan Left trigger thumb.  Non operative and operative treatment options have been discussed with the patient and patient wishes to proceed with operative treatment. Risks, benefits, and alternatives of surgery have been discussed and the patient agrees with the plan of care.   Melissa House 07/22/2023, 1:05 PM

## 2023-07-22 NOTE — Anesthesia Preprocedure Evaluation (Addendum)
 Anesthesia Evaluation  Patient identified by MRN, date of birth, ID band Patient awake    Reviewed: Allergy & Precautions, H&P , NPO status , Patient's Chart, lab work & pertinent test results  History of Anesthesia Complications (+) PONV and history of anesthetic complications  Airway Mallampati: II  TM Distance: >3 FB Neck ROM: Full    Dental no notable dental hx. (+) Teeth Intact, Dental Advisory Given   Pulmonary asthma , sleep apnea    Pulmonary exam normal breath sounds clear to auscultation       Cardiovascular hypertension, Pt. on medications  Rhythm:Regular Rate:Normal     Neuro/Psych    Depression    negative neurological ROS     GI/Hepatic Neg liver ROS,GERD  Medicated,,  Endo/Other  Hypothyroidism    Renal/GU negative Renal ROS  negative genitourinary   Musculoskeletal   Abdominal   Peds  Hematology negative hematology ROS (+)   Anesthesia Other Findings   Reproductive/Obstetrics negative OB ROS                             Anesthesia Physical Anesthesia Plan  ASA: 3  Anesthesia Plan: General   Post-op Pain Management: Tylenol PO (pre-op)*   Induction: Intravenous  PONV Risk Score and Plan: 4 or greater and Ondansetron, Dexamethasone, Midazolam, Propofol infusion and TIVA  Airway Management Planned: LMA  Additional Equipment:   Intra-op Plan:   Post-operative Plan: Extubation in OR  Informed Consent: I have reviewed the patients History and Physical, chart, labs and discussed the procedure including the risks, benefits and alternatives for the proposed anesthesia with the patient or authorized representative who has indicated his/her understanding and acceptance.     Dental advisory given  Plan Discussed with: CRNA  Anesthesia Plan Comments:        Anesthesia Quick Evaluation

## 2023-07-22 NOTE — Op Note (Signed)
 07/22/2023 [facility] OPERATIVE REPORT   PREOPERATIVE DIAGNOSIS: Left trigger thumb.  POSTOPERATIVE DIAGNOSIS:  Left trigger thumb.  PROCEDURE: Left trigger thumb release.  SURGEON:  [name], MD  ASSISTANT:  None.  ANESTHESIA:  General.  IV FLUIDS:  Per anesthesia flow sheet.  ESTIMATED BLOOD LOSS:  Minimal.  COMPLICATIONS:  None.  SPECIMENS:  None.  TOURNIQUET TIME:  Total Tourniquet Time Documented: Upper Arm (Left) - 13 minutes Total: Upper Arm (Left) - 13 minutes   DISPOSITION:  Stable to PACU.  LOCATION: [facility]  INDICATIONS: Melissa House is a 53 y.o. female with triggering of the thumb.  This has been injected without lasting resolution.  She wishes to proceed with surgical trigger release.  Risks, benefits and alternatives of surgery were discussed including the risk of blood loss, infection, damage to nerves, vessels, tendons, ligaments, bone, failure of surgery, need for additional surgery, complications with wound healing, continued pain, continued triggering and need for repeat surgery.  She voiced understanding of these risks and elected to proceed.  OPERATIVE COURSE:  After being identified preoperatively by myself, the patient and I agreed upon the procedure and site of procedure.  The surgical site was marked. Surgical consent had been signed. She was given IV Ancef as preoperative antibiotic prophylaxis. She was transported to the operating room and placed on the operating room table in supine position with the Left upper extremity on an arm board. General anesthesia was induced by the anesthesiologist.  The Left upper extremity was prepped and draped in normal sterile orthopedic fashion. A surgical pause was performed between surgeons, anesthesia, and operating room staff, and all were in agreement as to the patient, procedure, and site of procedure.  Tourniquet at the proximal aspect of the extremity was inflated to 250  mmHg after exsanguination of the arm with an Esmarch bandage.  An incision was made transversely at the MP flexion crease of the thumb.  This was made through the skin only.  Spreading technique was used.  The radial and ulnar digital nerves were identified and were protected throughout the case. The flexor sheath was identified.  The A1 pulley was identified.  It was sharply incised.  It was released in its entirety.  Care was taken to avoid any release of the oblique pulley. The thumb was placed through a range of motion, there was noted to be no catching.  The wound was copiously irrigated with sterile saline. It was then closed with 4-0 nylon in a horizontal mattress fashion.  The wound was injected with  0.25% plain Marcaine to aid in postoperative analgesia.  It was then dressed with sterile Xeroform, 4x4s, and wrapped lightly with a Coban dressing.  Tourniquet was deflated at 13 minutes.  The fingertips were pink with brisk capillary refill after deflation of the tourniquet.  The operative drapes were broken down and the patient was awoken from anesthesia safely.  She was transferred back to the stretcher and taken to the PACU in stable condition.   I will see her back in the office in 1 week for postoperative followup.  I will give her a prescription for Norco 5/325 1-2 tabs PO q6 hours prn pain, dispense # 15.    [name], MD Electronically signed, 07/22/23

## 2023-07-22 NOTE — Anesthesia Procedure Notes (Signed)
 Procedure Name: LMA Insertion Date/Time: 07/22/2023 1:15 PM  Performed by: Roosvelt Harps, CRNAPre-anesthesia Checklist: Patient identified, Emergency Drugs available, Suction available and Patient being monitored Patient Re-evaluated:Patient Re-evaluated prior to induction Oxygen Delivery Method: Circle System Utilized Preoxygenation: Pre-oxygenation with 100% oxygen Induction Type: IV induction Ventilation: Mask ventilation without difficulty LMA: LMA inserted LMA Size: 4.0 Number of attempts: 1 Airway Equipment and Method: Bite block Placement Confirmation: positive ETCO2 Tube secured with: Tape Dental Injury: Teeth and Oropharynx as per pre-operative assessment

## 2023-07-22 NOTE — Anesthesia Postprocedure Evaluation (Signed)
 Anesthesia Post Note  Patient: Melissa House  Procedure(s) Performed: LEFT THUMB TRIGGER RELEASE (Left: Thumb)     Patient location during evaluation: PACU Anesthesia Type: General Level of consciousness: awake and alert Pain management: pain level controlled Vital Signs Assessment: post-procedure vital signs reviewed and stable Respiratory status: spontaneous breathing, nonlabored ventilation and respiratory function stable Cardiovascular status: blood pressure returned to baseline and stable Postop Assessment: no apparent nausea or vomiting Anesthetic complications: no  No notable events documented.  Last Vitals:  Vitals:   07/22/23 1415 07/22/23 1427  BP: 121/76   Pulse: 60 60  Resp: 18 16  Temp:  (!) 36.3 C  SpO2:  97%    Last Pain:  Vitals:   07/22/23 1427  TempSrc:   PainSc: 0-No pain                 Romaldo Saville,W. EDMOND

## 2023-07-22 NOTE — Transfer of Care (Signed)
 Immediate Anesthesia Transfer of Care Note  Patient: Melissa House  Procedure(s) Performed: LEFT THUMB TRIGGER RELEASE (Left: Thumb)  Patient Location: PACU  Anesthesia Type:General  Level of Consciousness: drowsy  Airway & Oxygen Therapy: Patient Spontanous Breathing and Patient connected to face mask oxygen  Post-op Assessment: Report given to RN and Post -op Vital signs reviewed and stable  Post vital signs: Reviewed and stable  Last Vitals:  Vitals Value Taken Time  BP 102/60 07/22/23 1345  Temp 36.5 C 07/22/23 1345  Pulse 58 07/22/23 1352  Resp 11 07/22/23 1352  SpO2 94 % 07/22/23 1352  Vitals shown include unfiled device data.  Last Pain:  Vitals:   07/22/23 1345  TempSrc:   PainSc: 0-No pain      Patients Stated Pain Goal: 7 (07/22/23 1158)  Complications: No notable events documented.

## 2023-07-23 ENCOUNTER — Encounter (HOSPITAL_BASED_OUTPATIENT_CLINIC_OR_DEPARTMENT_OTHER): Payer: Self-pay | Admitting: Orthopedic Surgery

## 2023-08-17 ENCOUNTER — Other Ambulatory Visit: Payer: Self-pay | Admitting: Neurosurgery

## 2023-08-17 DIAGNOSIS — M5416 Radiculopathy, lumbar region: Secondary | ICD-10-CM

## 2023-08-29 ENCOUNTER — Encounter: Payer: Self-pay | Admitting: Neurology

## 2023-09-05 ENCOUNTER — Ambulatory Visit: Payer: BC Managed Care – PPO | Admitting: Family Medicine

## 2023-11-30 ENCOUNTER — Ambulatory Visit (INDEPENDENT_AMBULATORY_CARE_PROVIDER_SITE_OTHER): Admitting: Podiatry

## 2023-11-30 DIAGNOSIS — M65971 Unspecified synovitis and tenosynovitis, right ankle and foot: Secondary | ICD-10-CM

## 2023-11-30 DIAGNOSIS — Q666 Other congenital valgus deformities of feet: Secondary | ICD-10-CM

## 2023-11-30 DIAGNOSIS — M205X1 Other deformities of toe(s) (acquired), right foot: Secondary | ICD-10-CM

## 2023-11-30 NOTE — Progress Notes (Signed)
 Subjective:  Patient ID: Melissa House, female    DOB: [birth year],  MRN: [id number]  Chief Complaint  Patient presents with   Toe Pain    Right big toe joint pain     53 y.o. female presents with the above complaint.  Patient presents with right first metatarsophalangeal joint pain.  Patient states that this came out of nowhere.  Hurts with ambulation worse with pressure pain scale is 5 out of 10 dull aching nature he has been doing this for a little while.  She wanted to discuss treatment options.  She does not wear any orthotics she wears regular shoes.   Review of Systems: Negative except as noted in the HPI. Denies N/V/F/Ch.  Past Medical History:  Diagnosis Date   Asthma    Depression    Ectopic pregnancy 1999   Hypertension    Hypothyroidism    PONV (postoperative nausea and vomiting)    Seasonal allergies     Current Outpatient Medications:    CALCIUM PO, Take by mouth., Disp: , Rfl:    escitalopram (LEXAPRO) 20 MG tablet, Take 20 mg by mouth daily. Takes 40 mg daily, Disp: , Rfl:    estradiol-norethindrone (ACTIVELLA) 1-0.5 MG tablet, Take 1 tablet by mouth daily., Disp: , Rfl:    HYDROcodone -acetaminophen  (NORCO/VICODIN) 5-325 MG tablet, Take 1 tablet by mouth every 6 (six) hours as needed for moderate pain (pain score 4-6)., Disp: 15 tablet, Rfl: 0   levothyroxine (SYNTHROID, LEVOTHROID) 125 MCG tablet, Take 125 mcg by mouth daily., Disp: , Rfl:    MAGNESIUM PO, Take by mouth., Disp: , Rfl:    modafinil  (PROVIGIL ) 200 MG tablet, Take 1 tablet (200mg ) every morning. May take 1/2 tablet (100mg ) at lunch if needed., Disp: 135 tablet, Rfl: 1   Multiple Vitamin (MULTIVITAMIN WITH MINERALS) TABS tablet, Take 1 tablet by mouth daily., Disp: , Rfl:    NIFEdipine (ADALAT CC) 30 MG 24 hr tablet, Take 30 mg by mouth daily., Disp: , Rfl:   Social History   Tobacco Use  Smoking Status Never  Smokeless Tobacco Never    No Known Allergies Objective:  There were no vitals  filed for this visit. There is no height or weight on file to calculate BMI. Constitutional Well developed. Well nourished.  Vascular Dorsalis pedis pulses palpable bilaterally. Posterior tibial pulses palpable bilaterally. Capillary refill normal to all digits.  No cyanosis or clubbing noted. Pedal hair growth normal.  Neurologic Normal speech. Oriented to person, place, and time. Epicritic sensation to light touch grossly present bilaterally.  Dermatologic Nails well groomed and normal in appearance. No open wounds. No skin lesions.  Orthopedic: Pain on palpation right first metatarsophalangeal joint pain with range of motion of the joint pain at the end range of motion no deep intra-articular pain noted.  These findings consistent with hallux limitus.  Pes planovalgus foot structure noted   Radiographs: None Assessment:   1. Synovitis of right foot   2. Hallux limitus, right   3. Pes planovalgus    Plan:  Patient was evaluated and treated and all questions answered.  Right first metatarsophalangeal joint synovitis with underlying hallux limitus - All questions and concerns were discussed with the patient in extensive detail given the amount of pain that she is having she would benefit from a steroid injection.  Patient agrees with plan elected proceed with steroid injection -A steroid injection was performed at right first metatarsophalangeal joint using 1% plain Lidocaine  and 10 mg of Kenalog.  This was well tolerated.  Pes planovalgus -I explained to patient the etiology of pes planovalgus and relationship with Planter fasciitis and various treatment options were discussed.  Given patient foot structure in the setting of Planter fasciitis I believe patient will benefit from custom-made orthotics to help control the hindfoot motion support the arch of the foot and take the stress away from plantar fascial.  Patient agrees with the plan like to proceed with orthotics -Patient was  casted for orthotics with soft Morton's extension and neuroma pad    No follow-ups on file.

## 2023-11-30 NOTE — Progress Notes (Signed)
 Orthotics   Patient was present and evaluated for Custom molded foot orthotics. Patient will benefit from CFO's to provide total contact to BIL MLA's helping to balance and distribute body weight more evenly across BIL feet helping to reduce plantar pressure and pain. Orthotic will also encourage FF / RF alignment  Patient was scanned today and will return for fitting upon receipt

## 2023-12-14 ENCOUNTER — Telehealth: Payer: Self-pay

## 2023-12-14 NOTE — Telephone Encounter (Signed)
 LVM to schedule orthotic fitting/ pu

## 2023-12-16 ENCOUNTER — Telehealth: Payer: Self-pay | Admitting: Podiatry

## 2023-12-16 NOTE — Telephone Encounter (Signed)
 Called and left message for patient to call and r/s appointment as Dr.Patel will not be available at her appointment time. Per Anabell message her if the 8:15 and 9:45 are unavailable and we can work from there to see where they can be added.

## 2023-12-21 IMAGING — US US BREAST*R* LIMITED INC AXILLA
1 series · 6 of 6 positions shown · non-contrast
Comparison: Previous exam(s).

CLINICAL DATA: Palpable lump in the right breast. Focal pain in the
right breast.

EXAM:
DIGITAL DIAGNOSTIC UNILATERAL RIGHT MAMMOGRAM WITH TOMOSYNTHESIS AND
CAD; ULTRASOUND RIGHT BREAST LIMITED
TECHNIQUE: Right digital diagnostic mammography and breast tomosynthesis was
performed. The images were evaluated with computer-aided detection.;
Targeted ultrasound examination of the right breast was performed

[Series 1: us breast*right* limited inc axilla · 0.06mm/px · 6 of 6 slices shown]
[im 1/6]
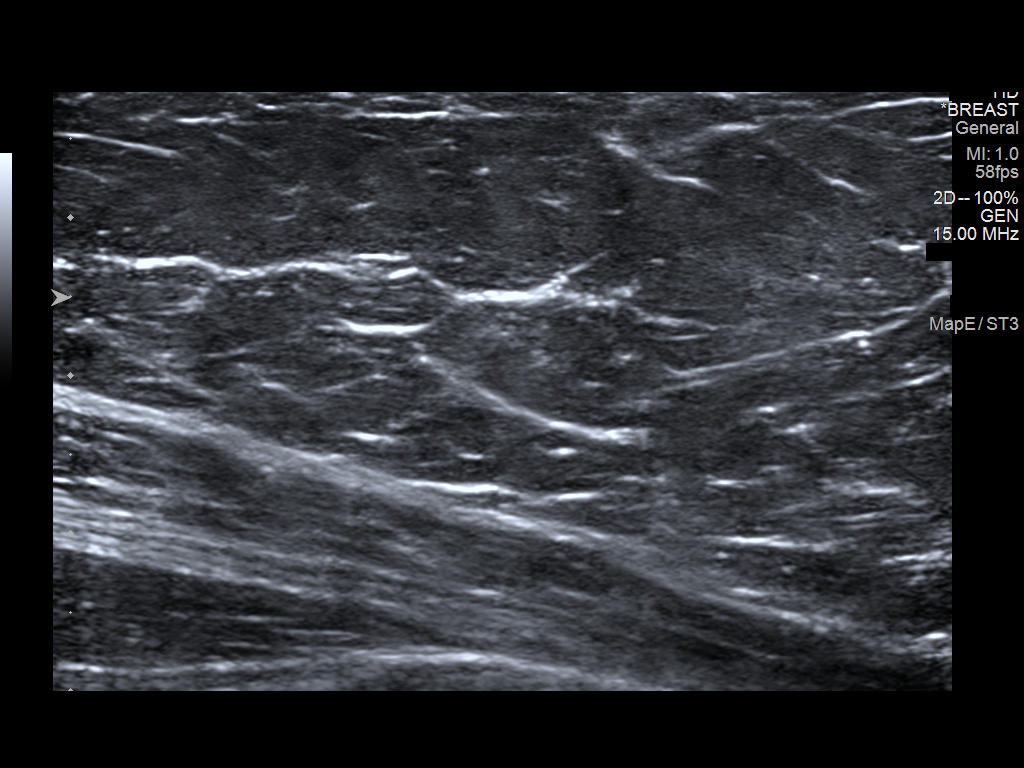
[im 2/6]
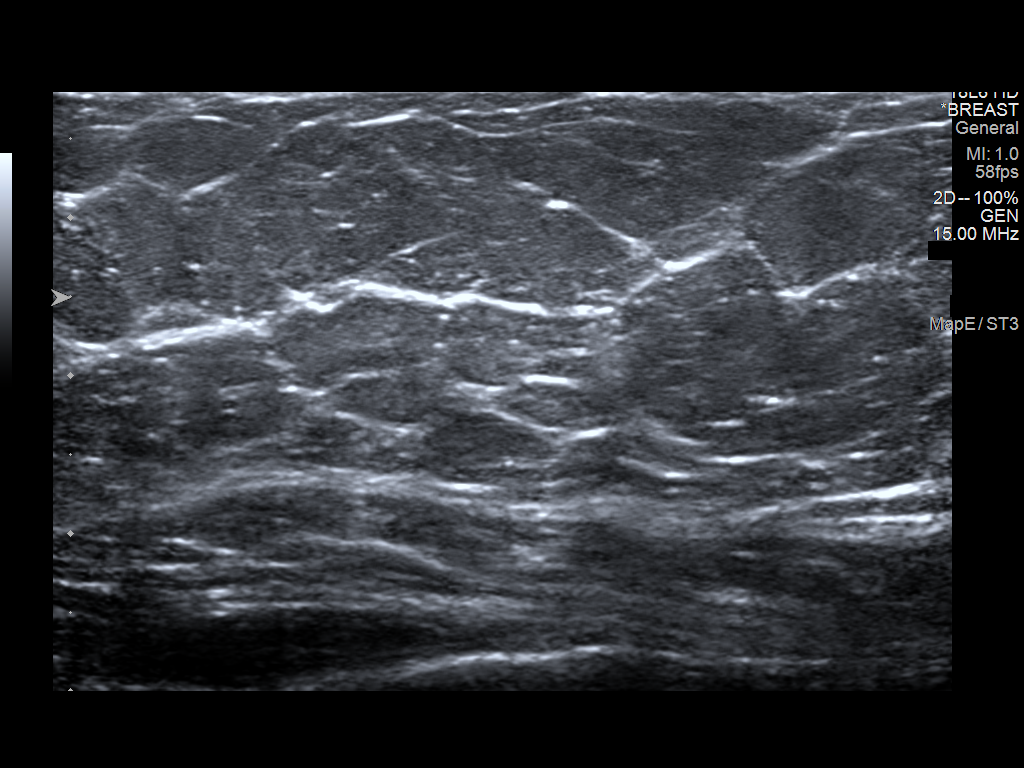
[im 3/6]
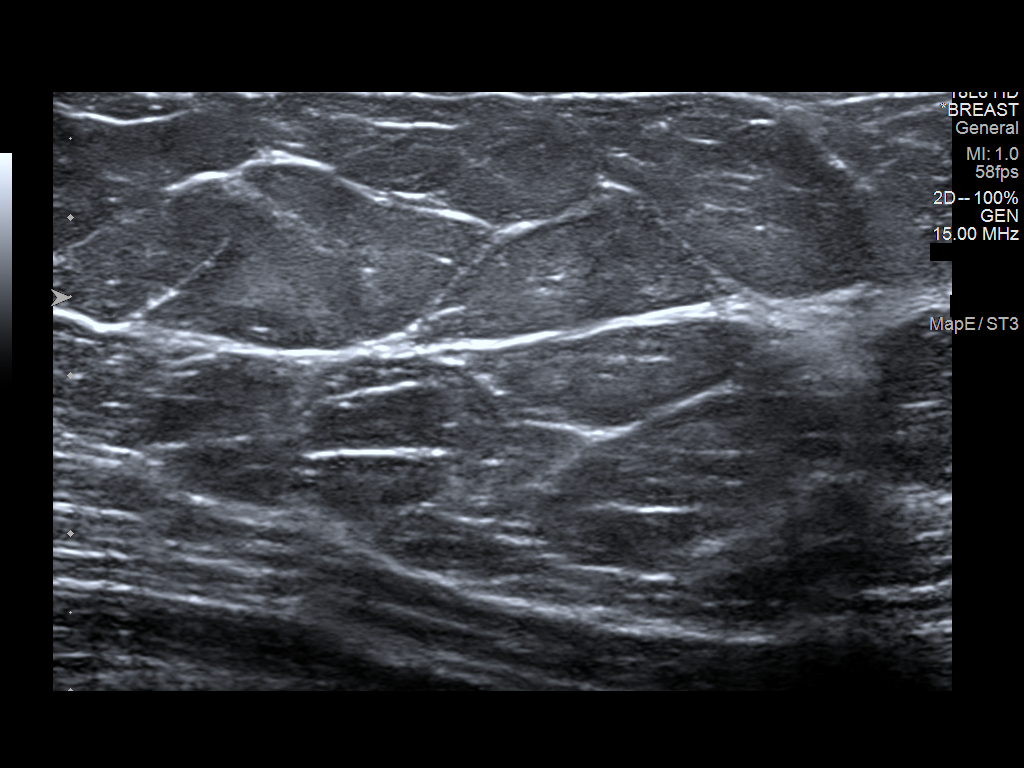
[im 4/6]
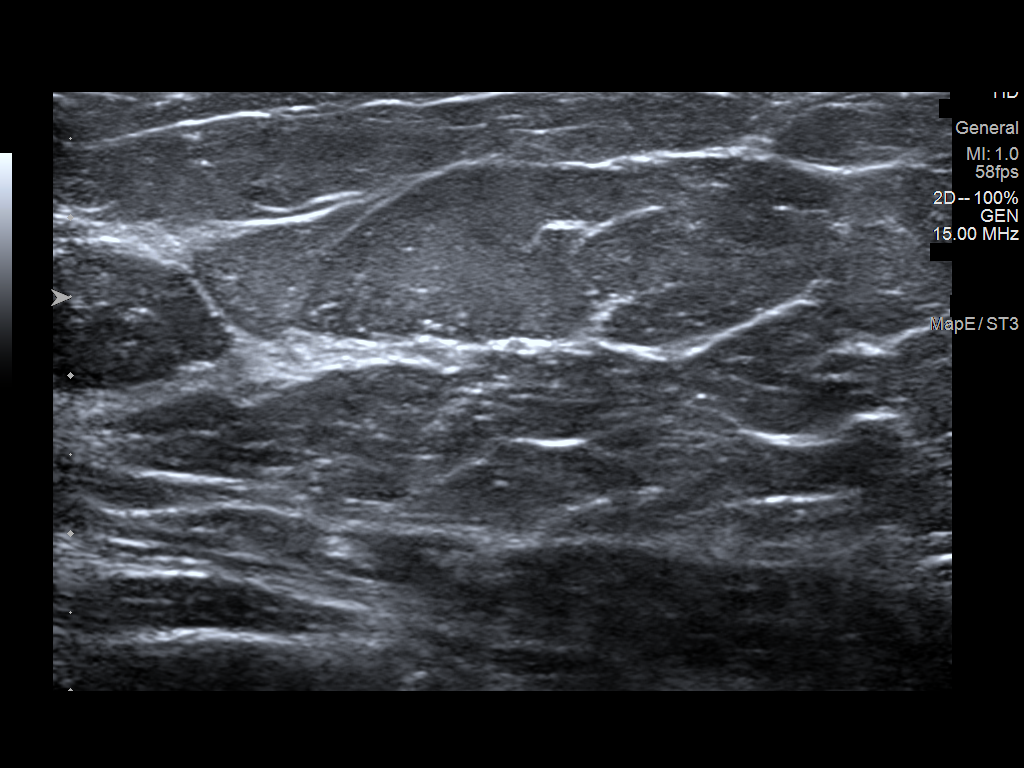
[im 5/6]
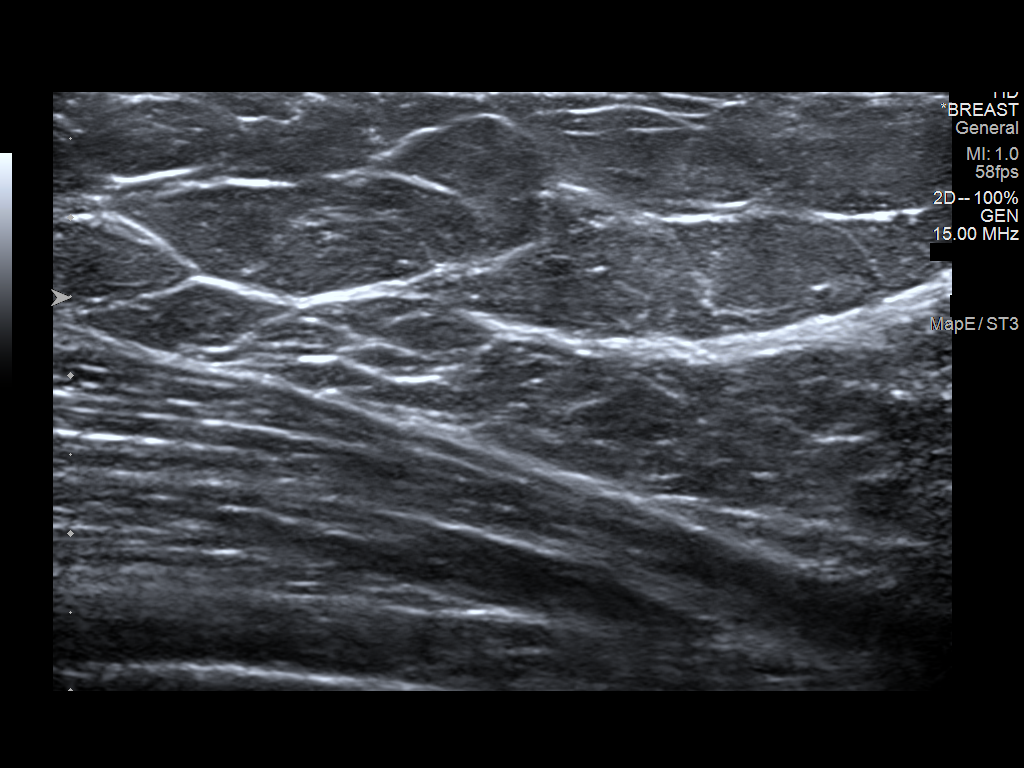
[im 6/6]
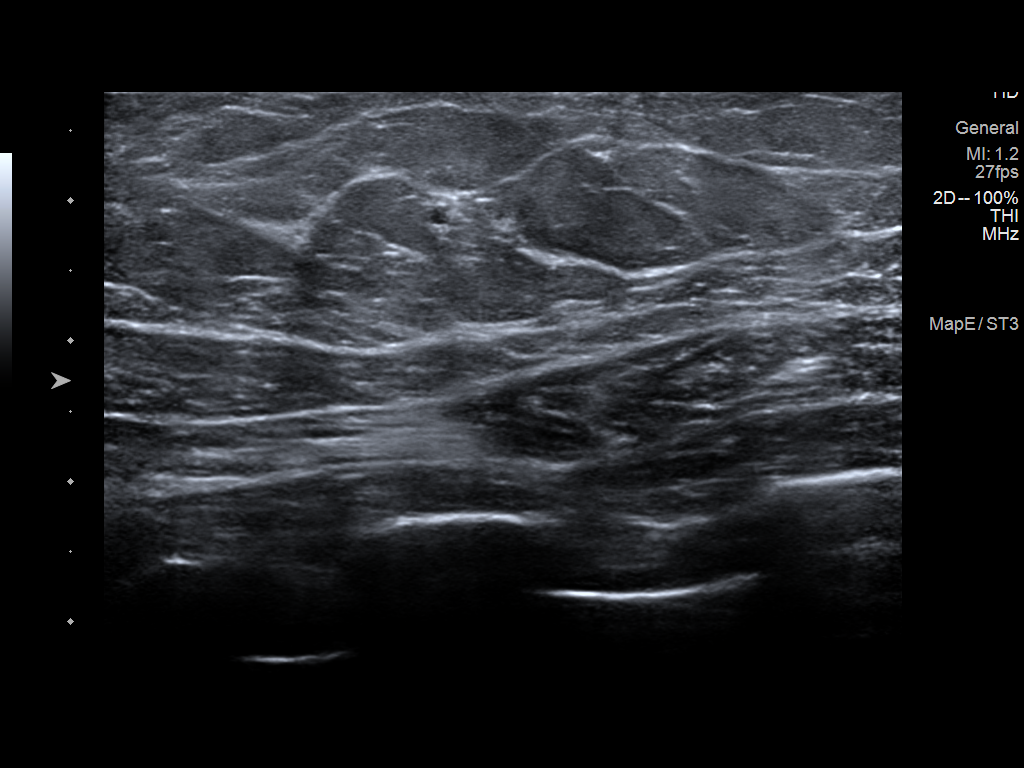

[6 of 6 positions shown; findings below may reference images not displayed]

ACR Breast Density Category b: There are scattered areas of
fibroglandular density.
FINDINGS: No suspicious masses, calcifications, or distortion are identified
in the right breast. There is an intramammary lymph node on the
right which is been stable for many years. This lymph node is
approximally 2.7 cm inferior to the lump. It is unclear whether the
patient is feeling the intramammary lymph node.

Targeted ultrasound is performed, showing no sonographic correlate
for the patient's pain or a lump. The patient does not appear to be
feeling the intramammary lymph node identified mammographically.
IMPRESSION: No mammographic or sonographic evidence of malignancy.

RECOMMENDATION:
Annual screening mammography. The patient's last screening mammogram
was April 28, 2021. The patient also states she is high risk for
breast cancer. The American cancer Society recommends breast MRI
annually in high risk patients. However, the patient did have a
benign MRI guided biopsy October 29, 2020 and per protocol a six-month
follow-up breast MRI was recommended but not performed.

I have discussed the findings and recommendations with the patient.
If applicable, a reminder letter will be sent to the patient
regarding the next appointment.

BI-RADS CATEGORY  2: Benign.

## 2023-12-22 ENCOUNTER — Ambulatory Visit (INDEPENDENT_AMBULATORY_CARE_PROVIDER_SITE_OTHER): Admitting: Podiatry

## 2023-12-22 DIAGNOSIS — M205X1 Other deformities of toe(s) (acquired), right foot: Secondary | ICD-10-CM | POA: Diagnosis not present

## 2023-12-22 DIAGNOSIS — M65971 Unspecified synovitis and tenosynovitis, right ankle and foot: Secondary | ICD-10-CM | POA: Diagnosis not present

## 2023-12-22 NOTE — Progress Notes (Unsigned)
 Subjective:  Patient ID: Melissa House, female    DOB: 20-Aug-1970,  MRN: [id number]  Chief Complaint  Patient presents with   Synovitis of right foot    Pt stated that her foot is doing better     53 y.o. female presents with the above complaint.  Patient presents with right first metatarsophalangeal joint pain.  Patient states that this came out of nowhere.  Hurts with ambulation worse with pressure pain scale is 5 out of 10 dull aching nature he has been doing this for a little while.  She wanted to discuss treatment options.  She does not wear any orthotics she wears regular shoes.   Review of Systems: Negative except as noted in the HPI. Denies N/V/F/Ch.  Past Medical History:  Diagnosis Date   Asthma    Depression    Ectopic pregnancy 1999   Hypertension    Hypothyroidism    PONV (postoperative nausea and vomiting)    Seasonal allergies     Current Outpatient Medications:    CALCIUM PO, Take by mouth., Disp: , Rfl:    escitalopram (LEXAPRO) 20 MG tablet, Take 20 mg by mouth daily. Takes 40 mg daily, Disp: , Rfl:    estradiol-norethindrone (ACTIVELLA) 1-0.5 MG tablet, Take 1 tablet by mouth daily., Disp: , Rfl:    HYDROcodone -acetaminophen  (NORCO/VICODIN) 5-325 MG tablet, Take 1 tablet by mouth every 6 (six) hours as needed for moderate pain (pain score 4-6)., Disp: 15 tablet, Rfl: 0   levothyroxine (SYNTHROID, LEVOTHROID) 125 MCG tablet, Take 125 mcg by mouth daily., Disp: , Rfl:    MAGNESIUM PO, Take by mouth., Disp: , Rfl:    modafinil  (PROVIGIL ) 200 MG tablet, Take 1 tablet (200mg ) every morning. May take 1/2 tablet (100mg ) at lunch if needed., Disp: 135 tablet, Rfl: 1   Multiple Vitamin (MULTIVITAMIN WITH MINERALS) TABS tablet, Take 1 tablet by mouth daily., Disp: , Rfl:    NIFEdipine (ADALAT CC) 30 MG 24 hr tablet, Take 30 mg by mouth daily., Disp: , Rfl:   Social History   Tobacco Use  Smoking Status Never  Smokeless Tobacco Never    No Known  Allergies Objective:  There were no vitals filed for this visit. There is no height or weight on file to calculate BMI. Constitutional Well developed. Well nourished.  Vascular Dorsalis pedis pulses palpable bilaterally. Posterior tibial pulses palpable bilaterally. Capillary refill normal to all digits.  No cyanosis or clubbing noted. Pedal hair growth normal.  Neurologic Normal speech. Oriented to person, place, and time. Epicritic sensation to light touch grossly present bilaterally.  Dermatologic Nails well groomed and normal in appearance. No open wounds. No skin lesions.  Orthopedic: Pain on palpation right first metatarsophalangeal joint pain with range of motion of the joint pain at the end range of motion no deep intra-articular pain noted.  These findings consistent with hallux limitus.  Pes planovalgus foot structure noted   Radiographs: None Assessment:   1. Synovitis of right foot   2. Hallux limitus, right     Plan:  Patient was evaluated and treated and all questions answered.  Right first metatarsophalangeal joint synovitis with underlying hallux limitus - All questions and concerns were discussed with the patient in extensive detail given the amount of pain that she is having she would benefit from a steroid injection.  Patient agrees with plan elected proceed with steroid injection -A second steroid injection was performed at right first metatarsophalangeal joint using 1% plain Lidocaine  and 10 mg  of Kenalog. This was well tolerated.  Pes planovalgus -I explained to patient the etiology of pes planovalgus and relationship with Planter fasciitis and various treatment options were discussed.  Given patient foot structure in the setting of Planter fasciitis I believe patient will benefit from custom-made orthotics to help control the hindfoot motion support the arch of the foot and take the stress away from plantar fascial.  Patient agrees with the plan like to  proceed with orthotics -Patient was casted for orthotics with soft Morton's extension and neuroma pad    No follow-ups on file.

## 2023-12-23 ENCOUNTER — Ambulatory Visit: Admitting: Podiatry

## 2023-12-28 ENCOUNTER — Other Ambulatory Visit (INDEPENDENT_AMBULATORY_CARE_PROVIDER_SITE_OTHER): Admitting: Podiatry

## 2023-12-28 NOTE — Progress Notes (Signed)
 Orthotics are dispensed and functioning well no acute complaints.  Break-in period was discussed.

## 2024-01-11 ENCOUNTER — Ambulatory Visit: Admitting: Podiatry

## 2024-03-27 ENCOUNTER — Other Ambulatory Visit: Payer: Self-pay | Admitting: Neurology

## 2024-03-27 DIAGNOSIS — G4711 Idiopathic hypersomnia with long sleep time: Secondary | ICD-10-CM

## 2024-03-27 DIAGNOSIS — G4719 Other hypersomnia: Secondary | ICD-10-CM

## 2024-03-27 NOTE — Telephone Encounter (Signed)
 Last refilled by patient on : 06/27/23 Last office visit : 06/27/23 Next office visit : 07/03/24 Per last office note - split Modafinil  for after noon dosing. 1 tab in AM and 1/2 tab at lunch time.

## 2024-06-14 ENCOUNTER — Telehealth: Payer: Self-pay | Admitting: Neurology

## 2024-06-14 NOTE — Telephone Encounter (Signed)
 Request to reschedule appointment

## 2024-06-19 ENCOUNTER — Ambulatory Visit: Admitting: Family Medicine

## 2024-07-03 ENCOUNTER — Ambulatory Visit: Payer: No Typology Code available for payment source | Admitting: Family Medicine
